# Patient Record
Sex: Male | Born: 1985 | Hispanic: Yes | Marital: Married | Smoking: Former smoker
Health system: Southern US, Community
[De-identification: ages and names within clinical notes are randomized; demographics above are authoritative.]

## PROBLEM LIST (undated history)

## (undated) DIAGNOSIS — T7840XA Allergy, unspecified, initial encounter: Secondary | ICD-10-CM

## (undated) DIAGNOSIS — K219 Gastro-esophageal reflux disease without esophagitis: Secondary | ICD-10-CM

## (undated) HISTORY — PX: HERNIA REPAIR: SHX51

## (undated) HISTORY — PX: OTHER SURGICAL HISTORY: SHX169

## (undated) HISTORY — DX: Gastro-esophageal reflux disease without esophagitis: K21.9

## (undated) HISTORY — DX: Allergy, unspecified, initial encounter: T78.40XA

---
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
## 2007-01-09 ENCOUNTER — Ambulatory Visit: Payer: Self-pay | Admitting: Family Medicine

## 2007-01-09 LAB — CONVERTED CEMR LAB
Cholesterol: 165 mg/dL (ref 0–200)
Glucose, Bld: 87 mg/dL (ref 70–99)
HDL: 35.6 mg/dL — ABNORMAL LOW (ref >39.0)
LDL Cholesterol: 104 mg/dL — ABNORMAL HIGH (ref 0–99)
Total CHOL/HDL Ratio: 4.6
Triglycerides: 129 mg/dL (ref 0–149)
VLDL: 26 mg/dL (ref 0–40)

## 2007-10-29 ENCOUNTER — Ambulatory Visit: Payer: Self-pay | Admitting: Internal Medicine

## 2008-01-16 ENCOUNTER — Encounter (INDEPENDENT_AMBULATORY_CARE_PROVIDER_SITE_OTHER): Payer: Self-pay | Admitting: Family Medicine

## 2008-01-16 ENCOUNTER — Encounter: Payer: Self-pay | Admitting: Family Medicine

## 2008-01-16 ENCOUNTER — Ambulatory Visit: Payer: Self-pay | Admitting: Family Medicine

## 2008-01-16 DIAGNOSIS — J45909 Unspecified asthma, uncomplicated: Secondary | ICD-10-CM | POA: Insufficient documentation

## 2008-01-16 DIAGNOSIS — J309 Allergic rhinitis, unspecified: Secondary | ICD-10-CM | POA: Insufficient documentation

## 2008-04-24 ENCOUNTER — Encounter: Payer: Self-pay | Admitting: Internal Medicine

## 2008-05-08 ENCOUNTER — Encounter: Payer: Self-pay | Admitting: Internal Medicine

## 2008-05-13 ENCOUNTER — Encounter: Payer: Self-pay | Admitting: Internal Medicine

## 2008-08-19 ENCOUNTER — Ambulatory Visit: Payer: Self-pay | Admitting: Internal Medicine

## 2008-09-02 ENCOUNTER — Ambulatory Visit: Payer: Self-pay | Admitting: Internal Medicine

## 2008-10-05 ENCOUNTER — Telehealth (INDEPENDENT_AMBULATORY_CARE_PROVIDER_SITE_OTHER): Payer: Self-pay | Admitting: *Deleted

## 2008-10-06 ENCOUNTER — Ambulatory Visit: Payer: Self-pay | Admitting: Internal Medicine

## 2008-10-08 ENCOUNTER — Encounter: Payer: Self-pay | Admitting: Internal Medicine

## 2008-11-26 ENCOUNTER — Encounter (INDEPENDENT_AMBULATORY_CARE_PROVIDER_SITE_OTHER): Payer: Self-pay | Admitting: *Deleted

## 2009-04-21 ENCOUNTER — Ambulatory Visit: Payer: Self-pay | Admitting: Internal Medicine

## 2009-08-23 ENCOUNTER — Ambulatory Visit: Payer: Self-pay | Admitting: Internal Medicine

## 2009-09-07 ENCOUNTER — Ambulatory Visit: Payer: Self-pay | Admitting: Internal Medicine

## 2009-09-23 ENCOUNTER — Ambulatory Visit: Payer: Self-pay | Admitting: Internal Medicine

## 2009-10-05 ENCOUNTER — Ambulatory Visit: Payer: Self-pay | Admitting: Internal Medicine

## 2009-10-05 LAB — CONVERTED CEMR LAB
Bilirubin Urine: NEGATIVE
Blood in Urine, dipstick: NEGATIVE
Glucose, Urine, Semiquant: NEGATIVE
Heterophile Ab Screen: NEGATIVE
Ketones, urine, test strip: NEGATIVE
Nitrite: NEGATIVE
Protein, U semiquant: NEGATIVE
Rapid Strep: NEGATIVE
Specific Gravity, Urine: 1.01
Urobilinogen, UA: 0.2
WBC Urine, dipstick: NEGATIVE
pH: 5

## 2009-10-06 ENCOUNTER — Telehealth: Payer: Self-pay | Admitting: Internal Medicine

## 2009-10-07 ENCOUNTER — Encounter (INDEPENDENT_AMBULATORY_CARE_PROVIDER_SITE_OTHER): Payer: Self-pay | Admitting: *Deleted

## 2009-10-08 ENCOUNTER — Telehealth: Payer: Self-pay | Admitting: Internal Medicine

## 2009-10-08 ENCOUNTER — Ambulatory Visit: Payer: Self-pay | Admitting: Internal Medicine

## 2009-10-08 ENCOUNTER — Telehealth (INDEPENDENT_AMBULATORY_CARE_PROVIDER_SITE_OTHER): Payer: Self-pay | Admitting: *Deleted

## 2009-10-08 ENCOUNTER — Encounter (INDEPENDENT_AMBULATORY_CARE_PROVIDER_SITE_OTHER): Payer: Self-pay | Admitting: *Deleted

## 2009-10-14 ENCOUNTER — Telehealth: Payer: Self-pay | Admitting: Internal Medicine

## 2010-04-13 ENCOUNTER — Ambulatory Visit: Payer: Self-pay | Admitting: Family Medicine

## 2010-10-03 ENCOUNTER — Ambulatory Visit: Payer: Self-pay | Admitting: Internal Medicine

## 2010-10-26 IMAGING — CT CT NECK W/ CM
5 series · 16 of 33 positions shown, 18 images · IV contrast (agent unspecified)
Comparison: None.

CLINICAL DATA: Sore throat and fever.  Rule out peritonsillar
abscess.

CT NECK WITH CONTRAST
TECHNIQUE: Multidetector CT imaging of the neck was performed with
intravenous contrast.
Contrast: 75 ml 0mnipaque-B55 IV.

[Series 3: neck_routine 3.0 b40s st · axial · 0.45mm/px · z∈[-318,-234]mm · 2 of 85 slices shown]
[im 29/85  bone]
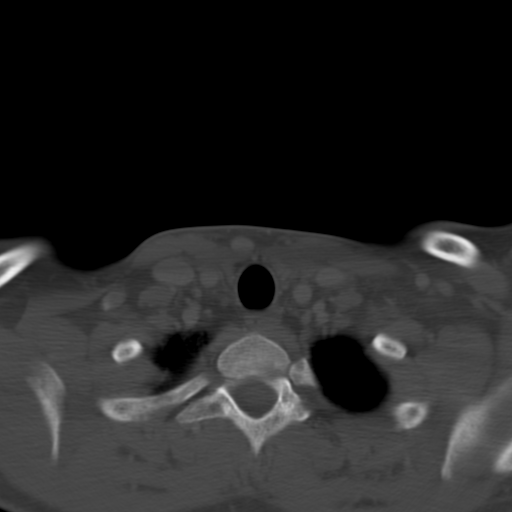
[im 57/85  bone]
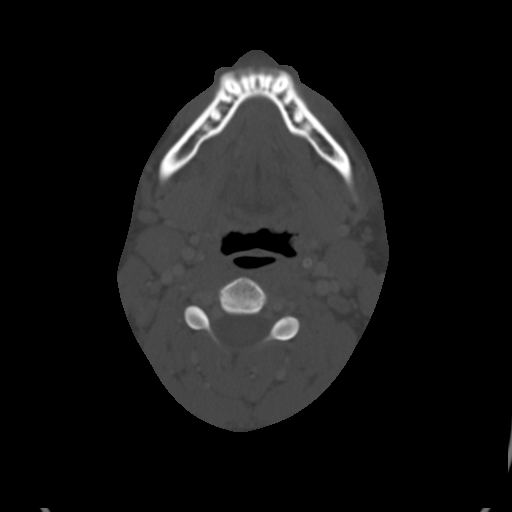

[Series 602: <mpr range> · coronal · 0.50mm/px · 3 of 92 slices shown]
[im 19/92  bone]
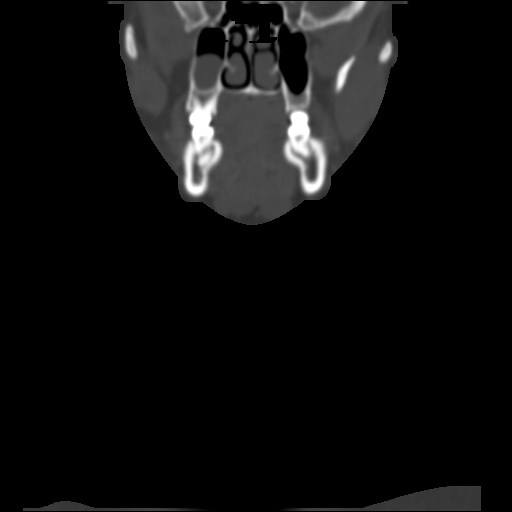
[im 37/92  bone]
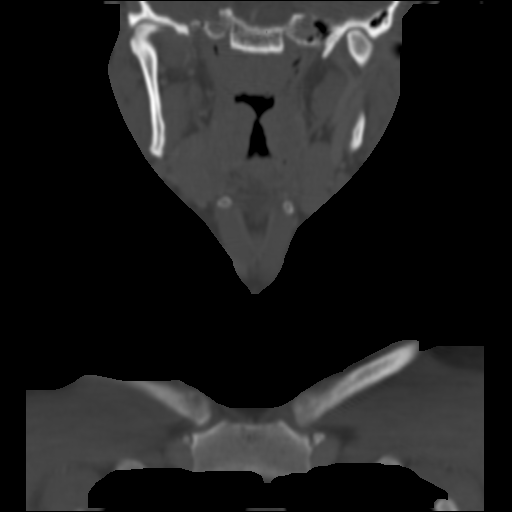
[im 55/92  bone]
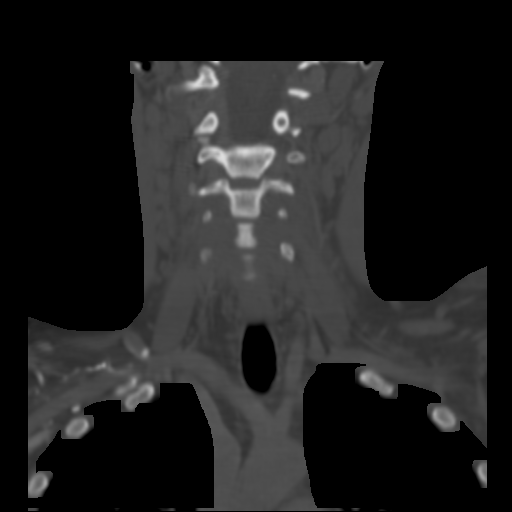

[Series 603: <mpr range(1)> · sagittal · 0.50mm/px · 5 of 84 slices shown, 6 images]
[im 28/84  bone]
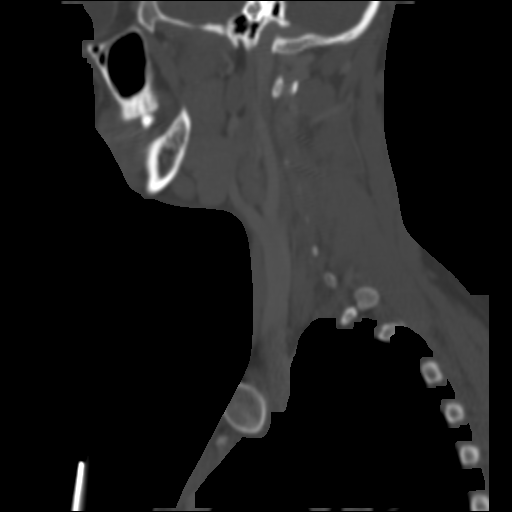
[im 35/84  bone]
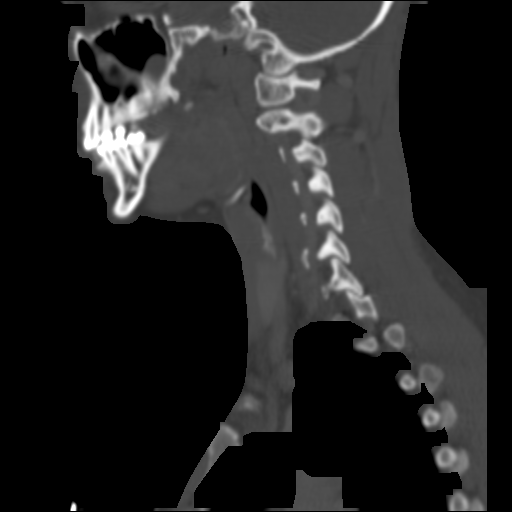
[im 42/84  soft-tissue]
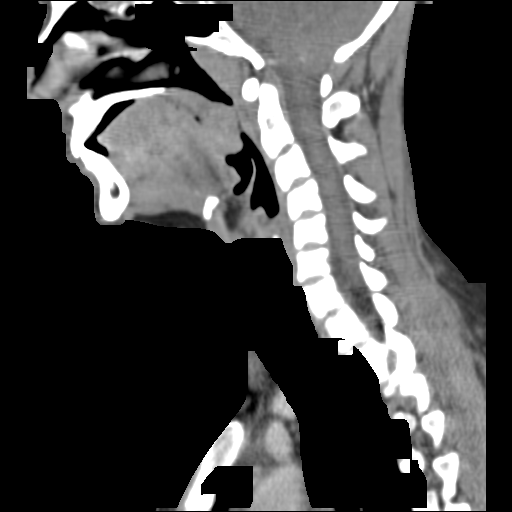
[im 42/84  bone]
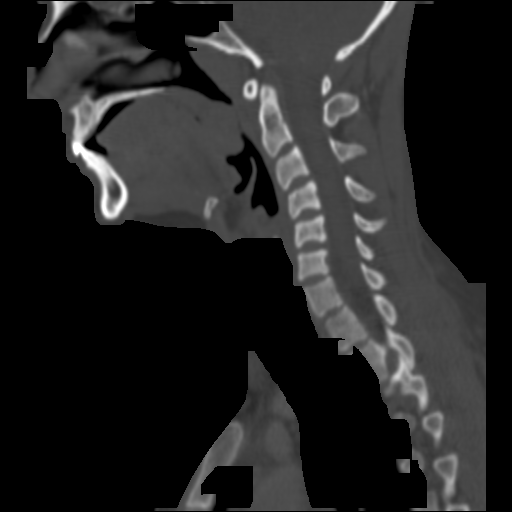
[im 49/84  bone]
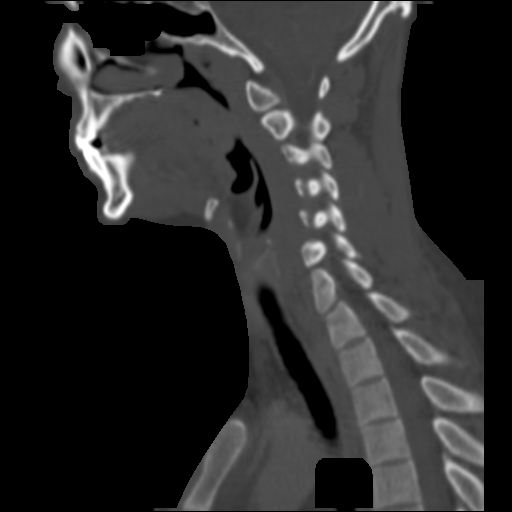
[im 56/84  bone]
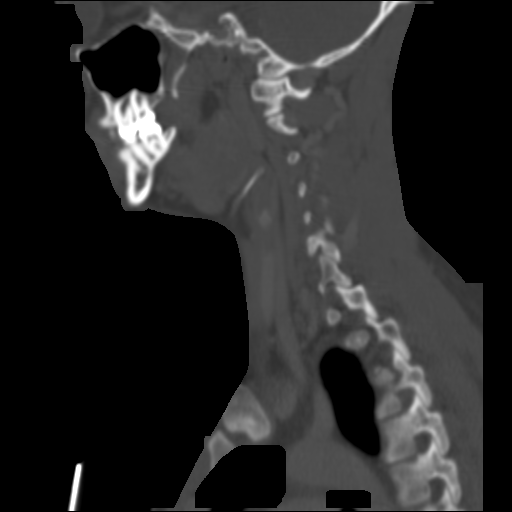

[Series 604: <mpr range(2)> · axial · 0.50mm/px · z∈[-370,-244]mm · 3 of 134 slices shown]
[im 34/134  bone]
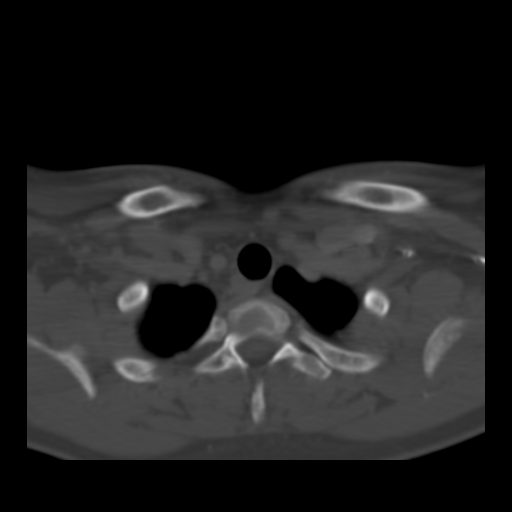
[im 67/134  bone]
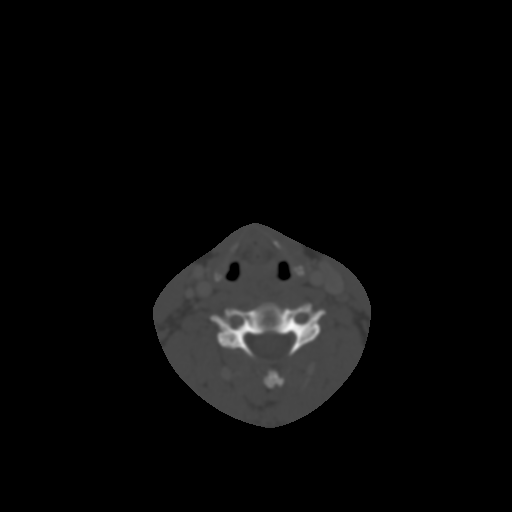
[im 100/134  bone]
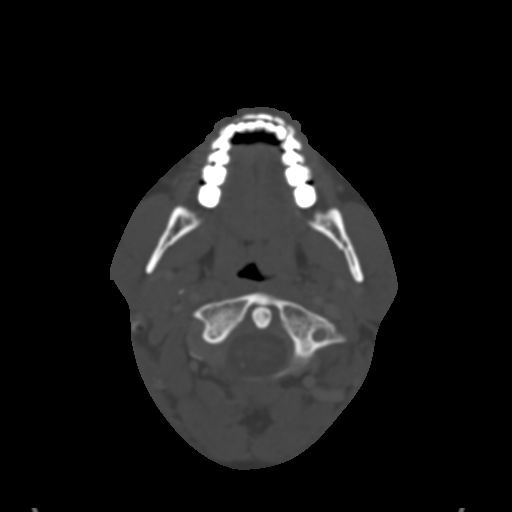

[Series 605: orth · axial · 0.50mm/px · z∈[-370,-242]mm · 3 of 135 slices shown, 4 images]
[im 34/135  soft-tissue]
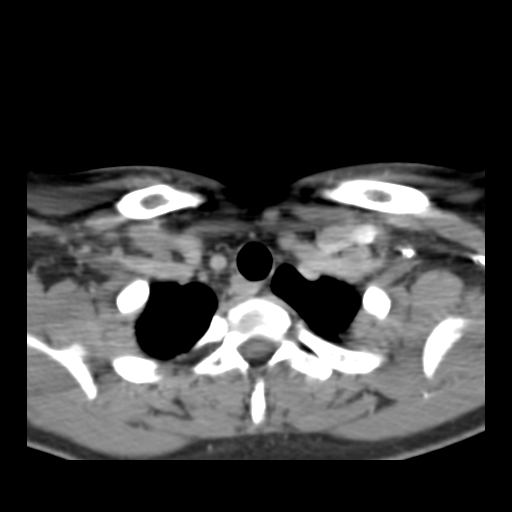
[im 34/135  bone]
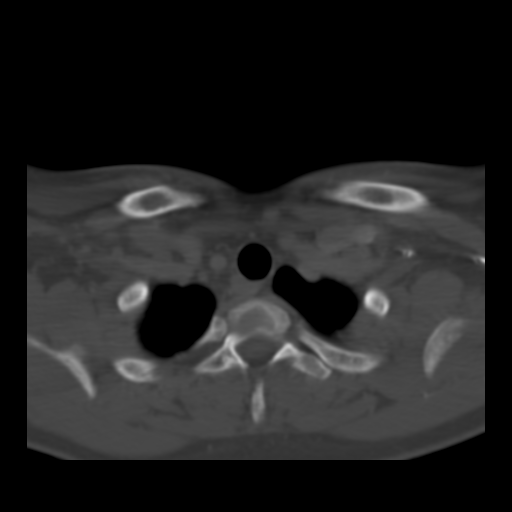
[im 68/135  bone]
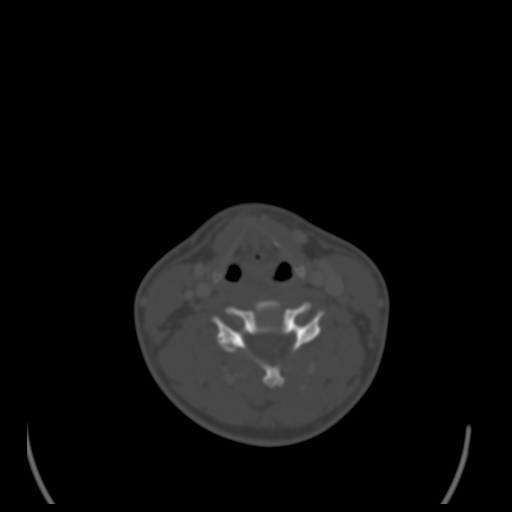
[im 101/135  bone]
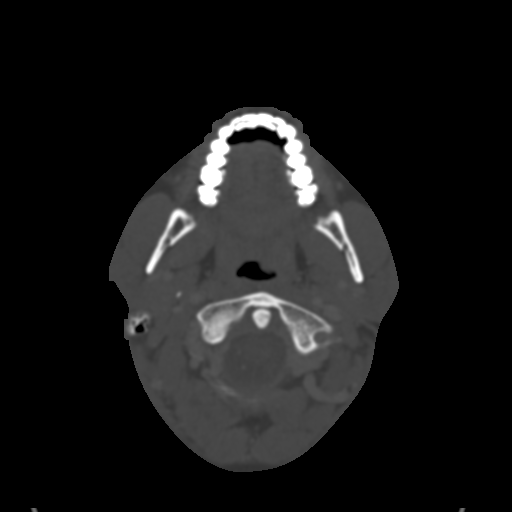

[16 of 33 positions shown; findings below may reference images not displayed]

FINDINGS: There is enlargement of the tonsils bilaterally, right
greater than left.  The tonsils have homogeneous soft tissue
density without evidence of abscess formation.  The epiglottis is
normal.  The larynx is normal.  The thyroid gland is normal.  The
submandibular and parotid glands are normal.

There is cervical adenopathy.  There is a right level II node
measuring 2.0 x 2.0 cm.  There is a left level II node measuring
2.4 x 1.9 cm.  Multiple other level I, level II and level III nodes
are present bilaterally.  These are likely reactive related to
infection.  If these areas do not resolve, biopsy is suggested to
rule out lymphoma.  The lung apices are clear.
IMPRESSION: There is enlargement of the tonsils bilaterally, right greater than
left.  No evidence of tonsillar abscess.

There is cervical lymphadenopathy bilaterally.  These are likely
reactive nodes related to infection.  If  they do not resolve
clinically, biopsy should be considered to exclude lymphoma.

## 2010-11-28 ENCOUNTER — Ambulatory Visit: Payer: Self-pay | Admitting: Internal Medicine

## 2011-01-10 NOTE — Letter (Signed)
Summary: [place] No Show Letter  [facility] [place]  [address] [place], [place] [address]   Phone: [phone]  Fax: [phone]    11/26/2008 MRN: [id number]  Avraham Nand [address] [place], [place]  [address]   Dear Mr. Jennet Maduro,   Our records indicate that you missed your scheduled appointment with _______Dr. Paz______________ on __12/17/09________.  Please contact this office to reschedule your appointment as soon as possible.  It is important that you keep your scheduled appointments with your physician, so we can provide you the best care possible.  Please be advised that there may be a charge for "no show" appointments.    Sincerely,   [facility]

## 2011-01-10 NOTE — Assessment & Plan Note (Signed)
Summary: ? SINUS INFECTION/RH......Marland Kitchen   Vital Signs:  Patient profile:   25 year old male Weight:      203 pounds O2 Sat:      97 % on Room air Temp:     98.3 degrees F oral Pulse rate:   60 / minute BP sitting:   122 / 74  (left arm)  Vitals Entered By: Doristine Devoid (Apr 13, 2010 1:20 PM)  O2 Flow:  Room air CC: sinus congestion, itchy eyes, and wheezing    History of Present Illness: 25 yo man here today for allergies.  sxs started 2 weeks ago.  reports he is starting to 'wheeze'.  itchy eyes.  nasal congestion and drainage.  Using Singulair daily.  Also using nasal steroid.  ran out of albuterol.  sxs are worsening.  denies facial pain/pressure, fever, cough.  Current Medications (verified): 1)  Claritin 10 Mg Caps (Loratadine) .Marland Kitchen.. 1 Over The Counter Pill A Day 2)  Flonase 50 Mcg/act Susp (Fluticasone Propionate) .... 2 Sprays Each Day 3)  Proair Hfa 108 (90 Base) Mcg/act Aers (Albuterol Sulfate) .Marland Kitchen.. 1-2 Puffs Every 4-6 Hours As Needed 4)  Nexium 40 Mg Cpdr (Esomeprazole Magnesium) .Marland Kitchen.. 1 By Mouth Once Daily As Needed 5)  Singulair 10 Mg Tabs (Montelukast Sodium) .... Take One Tablet Daily 6)  Pataday 0.2 % Soln (Olopatadine Hcl) .Marland Kitchen.. 1 Drop Each Eye Daily.  Disp 1 Bottle  Allergies (verified): No Known Drug Allergies  Past History:  Past Medical History: Last updated: 01/16/2008 Allergic rhinitis Asthma  Review of Systems      See HPI  Physical Exam  General:  alert and well-developed.  nontoxic Head:  normocephalic and atraumatic.  Face symmetric, no TTP over sinuses Eyes:  conjunctival injection.   Ears:  External ear exam shows no significant lesions or deformities.  Otoscopic examination reveals clear canals, tympanic membranes are intact bilaterally without bulging, retraction, inflammation or discharge. Hearing is grossly normal bilaterally. Nose:  edematous turbinates Mouth:  + PND Lungs:  normal respiratory effort, no intercostal retractions, no  accessory muscle use, and normal breath sounds. Heart:  normal rate, regular rhythm, and no murmur.  normal rate, regular rhythm, and no murmur.     Impression & Recommendations:  Problem # 1:  ALLERGIC RHINITIS (ICD-477.9) Assessment Unchanged pt's sxs not controlled on Singulair.  add Zyrtec.  pataday for eye symptoms.  no wheezing on exam today but will refer albuterol inhaler.  continue nasal steroid.  reviewed supportive care and red flags that should prompt return.  Pt expresses understanding and is in agreement w/ this plan. The following medications were removed from the medication list:    Claritin 10 Mg Caps (Loratadine) .Marland Kitchen... 1 over the counter pill a day His updated medication list for this problem includes:    Flonase 50 Mcg/act Susp (Fluticasone propionate) .Marland Kitchen... 2 sprays each day    Zyrtec Allergy 10 Mg Tabs (Cetirizine hcl) .Marland Kitchen... 1 once daily prn  Complete Medication List: 1)  Flonase 50 Mcg/act Susp (Fluticasone propionate) .... 2 sprays each day 2)  Proair Hfa 108 (90 Base) Mcg/act Aers (Albuterol sulfate) .Marland Kitchen.. 1-2 puffs every 4-6 hours as needed 3)  Nexium 40 Mg Cpdr (Esomeprazole magnesium) .Marland Kitchen.. 1 by mouth once daily as needed 4)  Singulair 10 Mg Tabs (Montelukast sodium) .... Take one tablet daily 5)  Pataday 0.2 % Soln (Olopatadine hcl) .Marland Kitchen.. 1 drop each eye daily.  disp 1 bottle 6)  Zyrtec Allergy 10 Mg Tabs (  Cetirizine hcl) .Marland Kitchen.. 1 once daily prn  Patient Instructions: 1)  Please schedule a follow-up appointment as needed.  2)  Continue the Singulair and add the Zyrtec daily 3)  Continue the nasal spray every day 4)  Add the pataday eye drop daily to help with the itching 5)  Use the albuterol inhaler for your wheezing 6)  Call if things aren't improving 7)  Hang in there! Prescriptions: PROAIR HFA 108 (90 BASE) MCG/ACT AERS (ALBUTEROL SULFATE) 1-2 puffs every 4-6 hours as needed  #1 x 3   Entered and Authorized by:   Neena Rhymes MD   Signed by:    Neena Rhymes MD on 04/13/2010   Method used:   Electronically to        [facility] [phone]* (retail)       [address]       [place], [place]  [address]       Ph: [phone]       Fax: [phone]   RxID:   [id number] FLONASE 50 MCG/ACT SUSP (FLUTICASONE PROPIONATE) 2 sprays each day  #1 x 6   Entered and Authorized by:   Neena Rhymes MD   Signed by:   Neena Rhymes MD on 04/13/2010   Method used:   [facility] to        [facility] [phone]* (retail)       [address]       [place], [place]  [address]       Ph: [phone]       Fax: [phone]   RxID:   [id number] PATADAY 0.2 % SOLN (OLOPATADINE HCL) 1 drop each eye daily.  disp 1 bottle  #1 x 3   Entered and Authorized by:   Neena Rhymes MD   Signed by:   Neena Rhymes MD on 04/13/2010   Method used:   [facility] to        [facility] [phone]* (retail)       [address]       [place], [place]  [address]       Ph: [phone]       Fax: [phone]   RxID:   [id number]

## 2011-01-10 NOTE — Assessment & Plan Note (Signed)
Summary: FLU SHOT/RH.......  Nurse Visit  CC: Flu shot./kb   Allergies: No Known Drug Allergies  Orders Added: 1)  Admin 1st Vaccine [90471] 2)  Flu Vaccine 61yrs + [19147]         Flu Vaccine Consent Questions     Do you have a history of severe allergic reactions to this vaccine? no    Any prior history of allergic reactions to egg and/or gelatin? no    Do you have a sensitivity to the preservative Thimersol? no    Do you have a past history of Guillan-Barre Syndrome? no    Do you currently have an acute febrile illness? no    Have you ever had a severe reaction to latex? no    Vaccine information given and explained to patient? yes    Are you currently pregnant? no    Lot Number:[id number]   Exp Date:06/10/2011   Site Given  Left Deltoid IMu

## 2011-01-12 ENCOUNTER — Ambulatory Visit: Payer: Self-pay | Admitting: Internal Medicine

## 2011-01-12 NOTE — Assessment & Plan Note (Signed)
Summary: COLD, FEVER, COUGH; ALSO BACK HURTS//SPH--REMIND HIM TO SCHED...   Vital Signs:  Patient profile:   25 year old male Height:      72.5 inches Weight:      194.50 pounds BMI:     26.11 Pulse rate:   77 / minute Pulse rhythm:   regular BP sitting:   124 / 80  (left arm) Cuff size:   large  Vitals Entered By: Army Fossa CMA (November 28, 2010 10:02 AM) CC: Pt here c/o coughing x 1 week. c/o lower back pain- injured 2 weeks ago.  Comments Taking Dayquil  [facility] [address]     History of Present Illness: sick x 1 week cough, chest pain and congestion some neck pain      Current Medications (verified): 1)  Flonase 50 Mcg/act Susp (Fluticasone Propionate) .... 2 Sprays Each Day 2)  Proair Hfa 108 (90 Base) Mcg/act Aers (Albuterol Sulfate) .Marland Kitchen.. 1-2 Puffs Every 4-6 Hours As Needed 3)  Nexium 40 Mg Cpdr (Esomeprazole Magnesium) .Marland Kitchen.. 1 By Mouth Once Daily As Needed. Due For Office Visit. 4)  Singulair 10 Mg Tabs (Montelukast Sodium) .... Take One Tablet Daily  Allergies (verified): No Known Drug Allergies  Past History:  Past Medical History: Reviewed history from 01/16/2008 and no changes required. Allergic rhinitis Asthma  Past Surgical History: Reviewed history from 08/23/2009 and no changes required. R hernia repair R great toe surgery  Social History: Reviewed history from 08/19/2008 and no changes required. original from [place] Single 1 child (2 y/o girl)  Review of Systems General:  ?low grade fever the first 2 days responded to advil . ENT:  (+) sinus congestion , no nasal d/ c mild ST. Resp:  Denies coughing up blood; (+) yellow sputum  increased wheezing from baseline, albuterol two times a day (before used rarely). GI:  Denies diarrhea, nausea, and vomiting.   Physical Exam  General:  alert and well-developed.  nontoxic Head:  face symmetric Ears:  R ear normal and L ear normal.   Nose:  slightly congested Mouth:  no redness or  discharge Lungs:  normal respiratory effort and no intercostal retractions.  few rhonchi bilaterally, end expiratory wheezing. No increased work of breathing Heart:  normal rate, regular rhythm, and no murmur.  normal rate, regular rhythm, and no murmur.     Impression & Recommendations:  Problem # 1:  BRONCHITIS- ACUTE (ICD-466.0) bronchitis with mild asthma exacerbation. Anticipate asthma  will go back to baseline once the bronchitis is better. His updated medication list for this problem includes:    Proair Hfa 108 (90 Base) Mcg/act Aers (Albuterol sulfate) .Marland Kitchen... 1-2 puffs every 4-6 hours as needed    Singulair 10 Mg Tabs (Montelukast sodium) .Marland Kitchen... Take one tablet daily    Zithromax Z-pak 250 Mg Tabs (Azithromycin) .Marland Kitchen... As directed  Problem # 2:  ASTHMA (ICD-493.90) usually very well controlled except for the last few days, see #1 His updated medication list for this problem includes:    Proair Hfa 108 (90 Base) Mcg/act Aers (Albuterol sulfate) .Marland Kitchen... 1-2 puffs every 4-6 hours as needed    Singulair 10 Mg Tabs (Montelukast sodium) .Marland Kitchen... Take one tablet daily    Prednisone 20 Mg Tabs (Prednisone) .Marland Kitchen... 1 by mouth once daily x 5  Complete Medication List: 1)  Flonase 50 Mcg/act Susp (Fluticasone propionate) .... 2 sprays each day 2)  Proair Hfa 108 (90 Base) Mcg/act Aers (Albuterol sulfate) .Marland Kitchen.. 1-2 puffs every 4-6 hours as needed 3)  Nexium 40 Mg Cpdr (Esomeprazole magnesium) .Marland Kitchen.. 1 by mouth once daily as needed. due for office visit. 4)  Singulair 10 Mg Tabs (Montelukast sodium) .... Take one tablet daily 5)  Zithromax Z-pak 250 Mg Tabs (Azithromycin) .... As directed 6)  Prednisone 20 Mg Tabs (Prednisone) .Marland Kitchen.. 1 by mouth once daily x 5  Patient Instructions: 1)  rest, fluids, tylenol 2)  mucinex DM twice a day as needed for cough 3)  zithromax, prednisone as prescribed 4)  albuterol as needed for wheezing 5)  call if no better next week or if symptoms increase   Prescriptions: PREDNISONE 20 MG TABS (PREDNISONE) 1 by mouth once daily x 5  #5 x 0   Entered and Authorized by:   Elita Quick E. Cristela Stalder MD   Signed by:   Nolon Rod. Chrystal Zeimet MD on 11/28/2010   Method used:   Print then Give to Patient   RxID:   [id number] ZITHROMAX Z-PAK 250 MG TABS (AZITHROMYCIN) as directed  #1 x 0   Entered and Authorized by:   Nolon Rod. Gabriell Casimir MD   Signed by:   Nolon Rod. Aranza Geddes MD on 11/28/2010   Method used:   Print then Give to Patient   RxID:   [id number]    Orders Added: 1)  Est. Patient Level III [84696]

## 2011-04-25 ENCOUNTER — Other Ambulatory Visit: Payer: Self-pay | Admitting: Internal Medicine

## 2011-04-28 NOTE — Op Note (Signed)
NAME:  [name], [name] NO.:  [id number]   MEDICAL RECORD NO.:  [id number]                   PATIENT TYPE:  AMB   LOCATION:  [facility]                                  FACILITY:  [facility]   PHYSICIAN:  Lorre Munroe., M.D.            DATE OF BIRTH:  1986/09/18   DATE OF PROCEDURE:  05/13/2004  DATE OF DISCHARGE:                                 OPERATIVE REPORT   PREOPERATIVE DIAGNOSIS:  Indirect left inguinal hernia.   POSTOPERATIVE DIAGNOSIS:  Indirect left inguinal hernia.   OPERATION:  Repair of left inguinal hernia.   SURGEON:  Zigmund Daniel, M.D.   ANESTHESIA:  General.   PROCEDURE:  After the patient was monitored and anesthetized and had routine  preparation and draping of the right lower abdomen and groin, I made a  transverse incision from the edge of the pubic tubercle laterally about 5  cm.  I dissected through the subcutaneous tissues getting hemostasis with  the cautery and encountered the external oblique aponeurosis which I opened  in the direction of the fibers into the superficial ring taking care to  avoid injury to the ilioinguinal nerve.  I could see that the spermatic cord  was somewhat expanded.  I mobilized it at the pubic tubercle and encircled  it with a Penrose drain and dissected the cremaster fibers up to produce  direct exit of the cord from the deep ring.  I then opened the cord  longitudinally in the proximal aspect, found quite a bit of fat in it, and a  fairly large indirect hernia sac.  I reduced the fat in the sac through the  deep ring and plugged the superior aspect of the ring with a cylinder of  polypropylene mesh held in place with a single 2-0 silk stitch.  I fashioned  a patch of polypropylene mesh with a slit cut in it to allow the spermatic  cord to exit from the abdominal wall into the scrotum and sewed that in from  the pubic tubercle and sewed superiorly and medially with a running basting  stitch in the  internal oblique fascia and laterally and inferiorly with a  running simple stitch in the inguinal ligament.  I used 2-0 Prolene suture  for this.  I used a single stitch lateral to the cord and deep ring to join  the tails together and I felt the repair was secure.  I thoroughly  anesthetized the operative site with long acting local anesthetic.  Sponge,  needle, and instrument counts were correct.  I closed the external oblique  and subcutaneous tissues with running layers of 3-0 Vicryl suture and closed  the skin with intracuticular 4-0 Vicryl and Steri-Strips.  He tolerated the  operation well.  Lorre Munroe., M.D.    Jodi Marble  D:  05/13/2004  T:  05/13/2004  Job:  [id number]

## 2011-05-29 ENCOUNTER — Other Ambulatory Visit: Payer: Self-pay | Admitting: Internal Medicine

## 2011-05-29 NOTE — Telephone Encounter (Signed)
Pt needs appt before additional refills are given.

## 2011-06-06 NOTE — Telephone Encounter (Signed)
LMOM for patient to call back.

## 2011-06-13 ENCOUNTER — Encounter: Payer: Self-pay | Admitting: Internal Medicine

## 2011-06-13 NOTE — Telephone Encounter (Signed)
Called only number=home number--patient not home--will mail letter to call and schedule appt in order to continue getting refills

## 2011-06-28 NOTE — Telephone Encounter (Signed)
Patient has appt for Fri 7/20

## 2011-06-29 ENCOUNTER — Encounter: Payer: Self-pay | Admitting: Internal Medicine

## 2011-06-30 ENCOUNTER — Ambulatory Visit: Payer: Self-pay | Admitting: Internal Medicine

## 2011-06-30 DIAGNOSIS — Z0289 Encounter for other administrative examinations: Secondary | ICD-10-CM

## 2011-07-03 ENCOUNTER — Encounter: Payer: Self-pay | Admitting: Internal Medicine

## 2011-07-03 ENCOUNTER — Ambulatory Visit (INDEPENDENT_AMBULATORY_CARE_PROVIDER_SITE_OTHER): Payer: BC Managed Care – PPO | Admitting: Internal Medicine

## 2011-07-03 DIAGNOSIS — J309 Allergic rhinitis, unspecified: Secondary | ICD-10-CM

## 2011-07-03 DIAGNOSIS — J45909 Unspecified asthma, uncomplicated: Secondary | ICD-10-CM

## 2011-07-03 DIAGNOSIS — K219 Gastro-esophageal reflux disease without esophagitis: Secondary | ICD-10-CM

## 2011-07-03 MED ORDER — PROAIR HFA 108 (90 BASE) MCG/ACT IN AERS: 2.0000 | INHALATION_SPRAY | Freq: Four times a day (QID) | RESPIRATORY_TRACT | Status: DC | PRN

## 2011-07-03 MED ORDER — FLUTICASONE PROPIONATE 50 MCG/ACT NA SUSP: 2.0000 | Freq: Every day | NASAL | Status: DC

## 2011-07-03 MED ORDER — ESOMEPRAZOLE MAGNESIUM 40 MG PO CPDR: 40.0000 mg | DELAYED_RELEASE_CAPSULE | Freq: Every day | ORAL | Status: DC

## 2011-07-03 MED ORDER — MONTELUKAST SODIUM 10 MG PO TABS: 10.0000 mg | ORAL_TABLET | Freq: Every day | ORAL | Status: DC

## 2011-07-03 NOTE — Assessment & Plan Note (Signed)
Well controlled, we feel

## 2011-07-03 NOTE — Assessment & Plan Note (Signed)
See history of present illness, no history of heartburn, Prevacid did not help no, current Nexium, symptoms well controlled as long as he takes it routinely. Plan: Keep him on  Nexium for now. At some point we may need to consider a  step down therapy

## 2011-07-03 NOTE — Assessment & Plan Note (Signed)
Symptoms well-controlled, hardly ever uses his inhaler

## 2011-07-03 NOTE — Progress Notes (Signed)
  Subjective:    Patient ID: Newell Wafer, male    DOB: December 22, 1985, 25 y.o.   MRN: [id number]  HPI Here for medication refill, doing well.  Past Medical History  Diagnosis Date  . Allergic rhinitis   . Asthma   . GERD (gastroesophageal reflux disease)    Past Surgical History  Procedure Date  . Hernia repair     right  . Great toe surgery     right      Review of Systems Asthma seems to be well-controlled, no cough or wheezes. Symptoms are usually triggered by strong fumes or chemicals (patient is a Education administrator). Long history of GERD, previously Prevacid did not help, currently on Nexium, symptoms are well controlled as long as he does not forget to take his medication. No dysphasia or odynophagia    Objective:   Physical Exam  Constitutional: He appears well-developed and well-nourished. No distress.  HENT:  Head: Normocephalic and atraumatic.  Cardiovascular: Normal rate, regular rhythm and normal heart sounds.   Musculoskeletal: He exhibits no edema.  Lymphadenopathy:    He has no cervical adenopathy.  Skin: He is not diaphoretic.          Assessment & Plan:

## 2011-09-13 ENCOUNTER — Ambulatory Visit (INDEPENDENT_AMBULATORY_CARE_PROVIDER_SITE_OTHER): Payer: BC Managed Care – PPO

## 2011-09-13 DIAGNOSIS — Z23 Encounter for immunization: Secondary | ICD-10-CM

## 2011-10-20 ENCOUNTER — Ambulatory Visit (INDEPENDENT_AMBULATORY_CARE_PROVIDER_SITE_OTHER): Payer: BC Managed Care – PPO | Admitting: Internal Medicine

## 2011-10-20 ENCOUNTER — Encounter: Payer: Self-pay | Admitting: Internal Medicine

## 2011-10-20 VITALS — BP 108/62 | HR 89 | Temp 97.7°F | Resp 18 | Ht 72.44 in | Wt 192.2 lb

## 2011-10-20 DIAGNOSIS — J45909 Unspecified asthma, uncomplicated: Secondary | ICD-10-CM

## 2011-10-20 MED ORDER — PREDNISONE 10 MG PO TABS: ORAL_TABLET | ORAL | Status: AC

## 2011-10-20 MED ORDER — AMOXICILLIN 500 MG PO CAPS: 1000.0000 mg | ORAL_CAPSULE | Freq: Two times a day (BID) | ORAL | Status: AC

## 2011-10-20 NOTE — Patient Instructions (Signed)
Rest, fluids , tylenol For cough, take Mucinex DM twice a day as needed  For congestion use Sudafed (pseudoephedrine) behind the counter 30 mg every 4 to 6 hours as needed Take prednisone and  Amoxicillin as prescribed  Use the albuterol as needed Call if no better in few days Call anytime if the symptoms are severe, you have high fever, short of breath

## 2011-10-20 NOTE — Assessment & Plan Note (Addendum)
Presents  With an exacerbation, See instructions

## 2011-10-20 NOTE — Progress Notes (Signed)
  Subjective:    Patient ID: Roger Morgan, male    DOB: 1986/01/16, 25 y.o.   MRN: [id number]  HPI Sx started 3 days ago: ST, moderate-severe sinus congestion , wheezing more frecuently, using albuterol 4 - 5 times a day  Past Medical History  Diagnosis Date  . Allergic rhinitis   . Asthma   . GERD (gastroesophageal reflux disease)    Past Surgical History  Procedure Date  . Hernia repair     right  . Great toe surgery     right     Review of Systems no F/C++ cough, more at night, white sputum, no GERD sx at present    Objective:   Physical Exam  Constitutional: He appears well-developed and well-nourished.  HENT:  Head: Normocephalic and atraumatic.  Right Ear: External ear normal.  Left Ear: External ear normal.  Mouth/Throat: No oropharyngeal exudate.       Face symmetric, TTP at frontal sinuses only  Cardiovascular: Normal rate, regular rhythm and normal heart sounds.   No murmur heard. Pulmonary/Chest:       Few ens exp wheezes, no increased WOB          Assessment & Plan:

## 2011-11-23 ENCOUNTER — Other Ambulatory Visit: Payer: Self-pay | Admitting: Internal Medicine

## 2011-11-23 MED ORDER — DESLORATADINE 5 MG PO TABS: 5.0000 mg | ORAL_TABLET | Freq: Every day | ORAL | Status: DC | PRN

## 2011-11-23 NOTE — Telephone Encounter (Signed)
Med removed from list on 04-13-10, last filled 10-06-08 #30 6.Please advise ok to add to med list and fill

## 2011-11-23 NOTE — Telephone Encounter (Signed)
Ok  Clarinex 5 mg 1 by mouth q. day when necessary for  allergies, #90, one refill

## 2011-11-23 NOTE — Telephone Encounter (Signed)
Pt aware Rx sent.  

## 2012-01-15 ENCOUNTER — Ambulatory Visit (INDEPENDENT_AMBULATORY_CARE_PROVIDER_SITE_OTHER): Payer: BC Managed Care – PPO | Admitting: Internal Medicine

## 2012-01-15 ENCOUNTER — Encounter: Payer: Self-pay | Admitting: Internal Medicine

## 2012-01-15 DIAGNOSIS — Z23 Encounter for immunization: Secondary | ICD-10-CM

## 2012-01-15 DIAGNOSIS — J309 Allergic rhinitis, unspecified: Secondary | ICD-10-CM

## 2012-01-15 DIAGNOSIS — Z0000 Encounter for general adult medical examination without abnormal findings: Secondary | ICD-10-CM

## 2012-01-15 NOTE — Patient Instructions (Signed)
Take Align (OTC probiotic) 1 a day for 1 month, call if not improving

## 2012-01-15 NOTE — Assessment & Plan Note (Signed)
No heartburn but some dyspepsia since he went to [place] 11-2011, see instructions (align)

## 2012-01-15 NOTE — Assessment & Plan Note (Addendum)
Td ~ 2004 Had a flu shot Diet, exercise and STE  Discussed  Labs

## 2012-01-15 NOTE — Assessment & Plan Note (Signed)
Well controlled 

## 2012-01-15 NOTE — Progress Notes (Signed)
  Subjective:    Patient ID: Roger Morgan, male    DOB: 04-01-1986, 26 y.o.   MRN: [id number]  HPI CPX  Past Medical History: GERD Allergic rhinitis Asthma  Past Surgical History: R hernia repair R great toe surgery  Social History: original from [facility] Single, 1 child (5 y/o girl) Tobacco--rarely  ETOH-- socially  Works in Pension scheme manager , no regular exercise lately  Family History CAD--no MI-- no Stroke DM-- GM? Colon ca--no Prostate ca--no  Review of Systems Denies chest pain or shortness of breath No nausea, vomiting, diarrhea. Since he came back from [place] last month, he has been having some abdominal discomfort described as "acid" but different from previous heartburn. Denies any diarrhea or blood in the stools. No anxiety or depression     Objective:   Physical Exam  Constitutional: He is oriented to person, place, and time. He appears well-developed and well-nourished.  HENT:  Head: Normocephalic and atraumatic.  Neck: No thyromegaly present.  Cardiovascular: Normal rate, regular rhythm and normal heart sounds.   No murmur heard. Pulmonary/Chest: Effort normal and breath sounds normal. No respiratory distress. He has no wheezes. He has no rales.  Abdominal: Soft. Bowel sounds are normal. He exhibits no distension. There is no tenderness. There is no rebound and no guarding.  Musculoskeletal: He exhibits no edema.  Neurological: He is alert and oriented to person, place, and time.  Skin: Skin is warm and dry.  Psychiatric: He has a normal mood and affect. His behavior is normal. Judgment and thought content normal.       Assessment & Plan:

## 2012-01-16 LAB — CBC WITH DIFFERENTIAL/PLATELET
Basophils Absolute: 0.1 10*3/uL (ref 0.0–0.1)
Basophils Relative: 1.6 % (ref 0.0–3.0)
Eosinophils Absolute: 0.5 10*3/uL (ref 0.0–0.7)
Eosinophils Relative: 9.6 % — ABNORMAL HIGH (ref 0.0–5.0)
HCT: 43.3 % (ref 39.0–52.0)
Hemoglobin: 15.3 g/dL (ref 13.0–17.0)
Lymphocytes Relative: 47.5 % — ABNORMAL HIGH (ref 12.0–46.0)
Lymphs Abs: 2.4 10*3/uL (ref 0.7–4.0)
MCHC: 35.3 g/dL (ref 30.0–36.0)
MCV: 97.1 fl (ref 78.0–100.0)
Monocytes Absolute: 0.4 10*3/uL (ref 0.1–1.0)
Monocytes Relative: 8.6 % (ref 3.0–12.0)
Neutro Abs: 1.6 10*3/uL (ref 1.4–7.7)
Neutrophils Relative %: 32.7 % — ABNORMAL LOW (ref 43.0–77.0)
Platelets: 215 10*3/uL (ref 150.0–400.0)
RBC: 4.46 Mil/uL (ref 4.22–5.81)
RDW: 12 % (ref 11.5–14.6)
WBC: 4.9 10*3/uL (ref 4.5–10.5)

## 2012-01-16 LAB — COMPREHENSIVE METABOLIC PANEL
ALT: 29 U/L (ref 0–53)
AST: 21 U/L (ref 0–37)
Albumin: 4.5 g/dL (ref 3.5–5.2)
Alkaline Phosphatase: 58 U/L (ref 39–117)
BUN: 21 mg/dL (ref 6–23)
CO2: 26 mEq/L (ref 19–32)
Calcium: 9.5 mg/dL (ref 8.4–10.5)
Chloride: 106 mEq/L (ref 96–112)
Creatinine, Ser: 1.2 mg/dL (ref 0.4–1.5)
GFR: 82.44 mL/min (ref >60.00)
Glucose, Bld: 79 mg/dL (ref 70–99)
Potassium: 3.9 mEq/L (ref 3.5–5.1)
Sodium: 141 mEq/L (ref 135–145)
Total Bilirubin: 0.6 mg/dL (ref 0.3–1.2)
Total Protein: 7.2 g/dL (ref 6.0–8.3)

## 2012-01-16 LAB — CHOLESTEROL, TOTAL: Cholesterol: 175 mg/dL (ref 0–200)

## 2012-01-16 LAB — TSH: TSH: 2.03 u[IU]/mL (ref 0.35–5.50)

## 2012-01-17 ENCOUNTER — Encounter: Payer: Self-pay | Admitting: Internal Medicine

## 2012-02-05 ENCOUNTER — Ambulatory Visit (INDEPENDENT_AMBULATORY_CARE_PROVIDER_SITE_OTHER): Payer: BC Managed Care – PPO | Admitting: Family Medicine

## 2012-02-05 ENCOUNTER — Ambulatory Visit: Payer: BC Managed Care – PPO

## 2012-02-05 VITALS — BP 112/70 | HR 53 | Temp 98.2°F | Resp 16 | Ht 71.0 in | Wt 180.0 lb

## 2012-02-05 DIAGNOSIS — M25519 Pain in unspecified shoulder: Secondary | ICD-10-CM

## 2012-02-05 DIAGNOSIS — S43409A Unspecified sprain of unspecified shoulder joint, initial encounter: Secondary | ICD-10-CM

## 2012-02-05 DIAGNOSIS — IMO0002 Reserved for concepts with insufficient information to code with codable children: Secondary | ICD-10-CM

## 2012-02-05 MED ORDER — NAPROXEN 500 MG PO TABS: 500.0000 mg | ORAL_TABLET | Freq: Two times a day (BID) | ORAL | Status: DC

## 2012-02-05 MED ORDER — PROAIR HFA 108 (90 BASE) MCG/ACT IN AERS: 2.0000 | INHALATION_SPRAY | Freq: Four times a day (QID) | RESPIRATORY_TRACT | Status: DC | PRN

## 2012-02-05 MED ORDER — CYCLOBENZAPRINE HCL 5 MG PO TABS: 5.0000 mg | ORAL_TABLET | Freq: Three times a day (TID) | ORAL | Status: AC | PRN

## 2012-02-05 NOTE — Progress Notes (Signed)
  Subjective:    Patient ID: Roger Morgan, male    DOB: 02/28/1986, 26 y.o.   MRN: [id number]  HPI 26 yo male with shoulder pain and injury.  Playing football yesterday and collided with another player with top of left shoulder.  Felt funny motion.  Kept playing.  Pain started this morning.  Had full range of motion last night.  Today hurts with movement now, restricting his motion.   Has injured it similarly before - no known exact injury.  Xrays were fine then and healed up with no problme.s    Review of Systems Negative except as per HPI     Objective:   Physical Exam  Constitutional: He appears well-developed and well-nourished.  Pulmonary/Chest: Effort normal.  Neurological: He is alert.  Skin: Skin is warm.   Left shoulder - appears symmetrical to right.  ttp over posterior musculature.  Extension normal.  Pain with abduction and with lowering from abduction.  Full strength but resisted triceps/biceps does cause pain.  Exam somewhat difficult with some guarding in painful motions. ROM increases with passive movement.    [facility] Primary radiology reading by Dr. Georgiana Shore: No fx/dislocation    Assessment & Plan:  Shoulder pain - suspect sprain.  Flexeril, naproxen, stretch, heat.  Recheck in 3-4 days.

## 2012-03-19 ENCOUNTER — Encounter: Payer: Self-pay | Admitting: Family Medicine

## 2012-03-19 ENCOUNTER — Ambulatory Visit (INDEPENDENT_AMBULATORY_CARE_PROVIDER_SITE_OTHER): Payer: BC Managed Care – PPO | Admitting: Family Medicine

## 2012-03-19 VITALS — BP 112/72 | HR 77 | Temp 98.3°F | Ht 72.0 in | Wt 182.6 lb

## 2012-03-19 DIAGNOSIS — J329 Chronic sinusitis, unspecified: Secondary | ICD-10-CM

## 2012-03-19 MED ORDER — AMOXICILLIN 875 MG PO TABS: 875.0000 mg | ORAL_TABLET | Freq: Two times a day (BID) | ORAL | Status: AC

## 2012-03-19 NOTE — Patient Instructions (Signed)
This is a sinus infection Start the Amoxicillin twice daily w/ food Drink plenty of fluids Continue your allergy meds Alternate tylenol and ibuprofen for pain and fever REST!! Hang in there!!!

## 2012-03-19 NOTE — Assessment & Plan Note (Signed)
Pt's sxs and PE consistent w/ infxn.  Start abx.  Reviewed supportive care and red flags that should prompt return.  Pt expressed understanding and is in agreement w/ plan.  

## 2012-03-19 NOTE — Progress Notes (Signed)
  Subjective:    Patient ID: Roger Morgan, male    DOB: 1986/05/15, 26 y.o.   MRN: [id number]  HPI ? Sinusitis- sxs started Sunday.  + sore throat, nasal congestion, facial pain.  Cough productive of yellow stuff.  Subjective temp last night- improved w/ ibuprofen.  + sick contacts.  No ear pain.  Taking Clarinex, singulair, Flonase.   Review of Systems For ROS see HPI     Objective:   Physical Exam  Vitals reviewed. Constitutional: He appears well-developed and well-nourished. No distress.  HENT:  Head: Normocephalic and atraumatic.  Right Ear: Tympanic membrane normal.  Left Ear: Tympanic membrane normal.  Nose: Mucosal edema and rhinorrhea present. Right sinus exhibits maxillary sinus tenderness and frontal sinus tenderness. Left sinus exhibits maxillary sinus tenderness and frontal sinus tenderness.  Mouth/Throat: Mucous membranes are normal. Oropharyngeal exudate and posterior oropharyngeal erythema present. No posterior oropharyngeal edema.       + PND  Eyes: Conjunctivae and EOM are normal. Pupils are equal, round, and reactive to light.  Neck: Normal range of motion. Neck supple.  Cardiovascular: Normal rate, regular rhythm and normal heart sounds.   Pulmonary/Chest: Effort normal and breath sounds normal. No respiratory distress. He has no wheezes.       + hacking cough  Lymphadenopathy:    He has no cervical adenopathy.  Skin: Skin is warm and dry.          Assessment & Plan:

## 2012-07-27 ENCOUNTER — Other Ambulatory Visit: Payer: Self-pay | Admitting: Internal Medicine

## 2012-07-29 NOTE — Telephone Encounter (Signed)
Refill done.  

## 2012-09-02 ENCOUNTER — Ambulatory Visit (INDEPENDENT_AMBULATORY_CARE_PROVIDER_SITE_OTHER): Payer: BC Managed Care – PPO | Admitting: Internal Medicine

## 2012-09-02 ENCOUNTER — Encounter: Payer: Self-pay | Admitting: Internal Medicine

## 2012-09-02 ENCOUNTER — Telehealth: Payer: Self-pay | Admitting: Internal Medicine

## 2012-09-02 VITALS — BP 118/80 | HR 72 | Temp 98.7°F | Wt 180.2 lb

## 2012-09-02 DIAGNOSIS — K219 Gastro-esophageal reflux disease without esophagitis: Secondary | ICD-10-CM

## 2012-09-02 DIAGNOSIS — R52 Pain, unspecified: Secondary | ICD-10-CM

## 2012-09-02 DIAGNOSIS — R1013 Epigastric pain: Secondary | ICD-10-CM

## 2012-09-02 MED ORDER — DEXLANSOPRAZOLE 60 MG PO CPDR: 60.0000 mg | DELAYED_RELEASE_CAPSULE | Freq: Every day | ORAL | Status: DC

## 2012-09-02 NOTE — Progress Notes (Signed)
  Subjective:    Patient ID: Roger Morgan, male    DOB: 13-Nov-1986, 26 y.o.   MRN: [id number]  HPI Symptoms began 08/24/12 his temperature elevation to 101F at approximately 4 PM. He treated this with 2 Advil.  On 9/16 he began to have nausea and vomiting. This was followed by generalized malaise over the last week . Every morning he has N&V. He describes burning discomfort in the midabdomen and increased burping despite taking Nexium 40 mg daily. He has run out of Nexium; he took Zantac with a similar response he gets with the Nexium.  He rarely drinks alcohol he does not smoke cigarettes. He denies excess intake of caffeine ( 1 glass of tea /day) or nonsteroidals  Past medical history/family history/social history were all reviewed and updated. Pertinent data: P uncle ? Ulcer; PMH of GERD    Review of Systems He describes mood swings with increased anger; this aggravates his abdominal pain.     Objective:   Physical Exam General appearance is one of good health and nourishment w/o distress.  Eyes: No conjunctival inflammation or scleral icterus is present.  Oral exam: Dental hygiene is good; lips and gums are healthy appearing.There is no oropharyngeal erythema or exudate noted.   Heart:  Normal rate and regular rhythm. S1 and S2 normal without gallop, murmur, click, rub or other extra sounds     Lungs:Chest clear to auscultation; no wheezes, rhonchi,rales ,or rubs present.No increased work of breathing.   Abdomen: bowel sounds normal, soft but slightly tender in epigastrium without masses, organomegaly or hernias noted.  No guarding or rebound   Skin:Warm & dry.  Intact without suspicious lesions or rashes ; no jaundice or tenting  Lymphatic: No lymphadenopathy is noted about the head, neck, axilla areas.              Assessment & Plan:  #1 abdominal pain #2 GERD

## 2012-09-02 NOTE — Patient Instructions (Addendum)
The triggers for dyspepsia or "heart burn"  include stress; the "aspirin family" ; alcohol; peppermint; and caffeine (coffee, tea, cola, and chocolate). The aspirin family would include aspirin and the nonsteroidal agents such as ibuprofen &  Naproxen. Tylenol would not cause reflux. If having dyspepsia ; food & drink should be avoided for @ least 2 hours before going to bed.   If you activate My Chart; the results can be released to you as soon as they populate from the lab. If you choose not to use this program; the labs have to be reviewed, copied & mailed causing a delay in getting the results to you.  

## 2012-09-02 NOTE — Telephone Encounter (Signed)
Caller: Kam[place]on/Patient; Patient Name: Roger Morgan; PCP: Willow Ora; Best Callback Phone Number: [phone]; Reason for call: Started with fever and Congestion on 08/24/12-  past week has felt nauseous,weak and has vomited every morning, having some diarrhea, chills, tired, lack of Appetite. He is congested, getting headaches and L ear hurts. Occasional cough with yellowish mucos. He is taking Advil at night with small meal and drinking plenty of fluids. Triage per URI Protocol and appointment advised within 24 hours for "facial pain, yellow-green nasal discharge AND any temperature elevation". Appointment scheduled for 1530 09/02/12 with Dr. Alwyn Ren.

## 2012-09-03 ENCOUNTER — Other Ambulatory Visit: Payer: Self-pay | Admitting: Internal Medicine

## 2012-09-03 DIAGNOSIS — B9681 Helicobacter pylori [H. pylori] as the cause of diseases classified elsewhere: Secondary | ICD-10-CM

## 2012-09-03 LAB — H. PYLORI ANTIBODY, IGG: H Pylori IgG: POSITIVE

## 2012-09-03 LAB — CBC WITH DIFFERENTIAL/PLATELET
Basophils Absolute: 0 10*3/uL (ref 0.0–0.1)
Basophils Relative: 0.6 % (ref 0.0–3.0)
Eosinophils Absolute: 0.2 10*3/uL (ref 0.0–0.7)
Eosinophils Relative: 3 % (ref 0.0–5.0)
HCT: 46.7 % (ref 39.0–52.0)
Hemoglobin: 15.6 g/dL (ref 13.0–17.0)
Lymphocytes Relative: 30.4 % (ref 12.0–46.0)
Lymphs Abs: 1.7 10*3/uL (ref 0.7–4.0)
MCHC: 33.5 g/dL (ref 30.0–36.0)
MCV: 99.1 fl (ref 78.0–100.0)
Monocytes Absolute: 0.4 10*3/uL (ref 0.1–1.0)
Monocytes Relative: 6.5 % (ref 3.0–12.0)
Neutro Abs: 3.3 10*3/uL (ref 1.4–7.7)
Neutrophils Relative %: 59.5 % (ref 43.0–77.0)
Platelets: 242 10*3/uL (ref 150.0–400.0)
RBC: 4.71 Mil/uL (ref 4.22–5.81)
RDW: 12.2 % (ref 11.5–14.6)
WBC: 5.5 10*3/uL (ref 4.5–10.5)

## 2012-09-03 LAB — AMYLASE: Amylase: 102 U/L (ref 27–131)

## 2012-09-03 LAB — HEPATIC FUNCTION PANEL
ALT: 18 U/L (ref 0–53)
AST: 15 U/L (ref 0–37)
Albumin: 4.7 g/dL (ref 3.5–5.2)
Alkaline Phosphatase: 59 U/L (ref 39–117)
Bilirubin, Direct: 0.2 mg/dL (ref 0.0–0.3)
Total Bilirubin: 1.3 mg/dL — ABNORMAL HIGH (ref 0.3–1.2)
Total Protein: 7.4 g/dL (ref 6.0–8.3)

## 2012-09-03 LAB — LIPASE: Lipase: 20 U/L (ref 11.0–59.0)

## 2012-09-03 MED ORDER — AMOXICILL-CLARITHRO-LANSOPRAZ PO MISC: Freq: Two times a day (BID) | ORAL | Status: DC

## 2012-10-01 ENCOUNTER — Ambulatory Visit (INDEPENDENT_AMBULATORY_CARE_PROVIDER_SITE_OTHER): Payer: BC Managed Care – PPO

## 2012-10-01 DIAGNOSIS — Z23 Encounter for immunization: Secondary | ICD-10-CM

## 2012-10-08 ENCOUNTER — Other Ambulatory Visit: Payer: Self-pay | Admitting: Family Medicine

## 2012-10-28 ENCOUNTER — Ambulatory Visit (INDEPENDENT_AMBULATORY_CARE_PROVIDER_SITE_OTHER)
Admission: RE | Admit: 2012-10-28 | Discharge: 2012-10-28 | Disposition: A | Discharge status: Home / Self Care | Payer: BC Managed Care – PPO | Source: Ambulatory Visit | Attending: Internal Medicine | Admitting: Internal Medicine

## 2012-10-28 ENCOUNTER — Encounter: Payer: Self-pay | Admitting: Internal Medicine

## 2012-10-28 ENCOUNTER — Ambulatory Visit (INDEPENDENT_AMBULATORY_CARE_PROVIDER_SITE_OTHER): Payer: BC Managed Care – PPO | Admitting: Internal Medicine

## 2012-10-28 VITALS — BP 110/66 | HR 62 | Temp 98.0°F | Wt 190.0 lb

## 2012-10-28 DIAGNOSIS — S6980XA Other specified injuries of unspecified wrist, hand and finger(s), initial encounter: Secondary | ICD-10-CM

## 2012-10-28 DIAGNOSIS — S6990XA Unspecified injury of unspecified wrist, hand and finger(s), initial encounter: Secondary | ICD-10-CM

## 2012-10-28 NOTE — Patient Instructions (Addendum)
Please get your x-ray at the other [facility]  office located at: [address] [place], across from [facility].  Please go to the basement, this is a walk-in facility, they are open from 8:30 to 5:30 PM. Phone number [phone]. --- We are referring you to a specialist

## 2012-10-28 NOTE — Assessment & Plan Note (Signed)
Suspect fracture versus ligament injury. X-ray Refer to ortho hand Continue using the splinter for now

## 2012-10-28 NOTE — Progress Notes (Signed)
  Subjective:    Patient ID: Roger Morgan, male    DOB: [birth year], 26 y.o.   MRN: [id number]  HPI Acute visit, Injury to his4th left finger playing soccer 2 weeks ago, using splinter, pain is about the same, edema has decrease  Past Medical History  Diagnosis Date  . Allergic rhinitis   . Asthma   . GERD (gastroesophageal reflux disease)    Past Surgical History  Procedure Date  . Hernia repair     right  . Great toe surgery     right     Review of Systems Denies pain in his wrist or any other part of his hand    Objective:   Physical Exam General -- alert, well-developed, and well-nourished.   Extremities-- Right hand normal Left hand normal except for a DIP fourth finger, is swollen, no deformity, slightly tender to palpation, limited flexion.  Psych-- Cognition and judgment appear intact. Alert and cooperative with normal attention span and concentration.  not anxious appearing and not depressed appearing.       Assessment & Plan:

## 2012-11-25 ENCOUNTER — Encounter: Payer: Self-pay | Admitting: Internal Medicine

## 2012-11-25 ENCOUNTER — Ambulatory Visit (INDEPENDENT_AMBULATORY_CARE_PROVIDER_SITE_OTHER): Payer: BC Managed Care – PPO | Admitting: Internal Medicine

## 2012-11-25 VITALS — BP 110/64 | HR 71 | Temp 97.9°F | Wt 194.0 lb

## 2012-11-25 DIAGNOSIS — K219 Gastro-esophageal reflux disease without esophagitis: Secondary | ICD-10-CM

## 2012-11-25 MED ORDER — ESOMEPRAZOLE MAGNESIUM 40 MG PO PACK: 40.0000 mg | PACK | Freq: Every day | ORAL | Status: DC

## 2012-11-25 NOTE — Progress Notes (Signed)
  Subjective:    Patient ID: Trip Cavanagh, male    DOB: 20-Aug-1986, 26 y.o.   MRN: [id number]  HPI Followup  Visit Since the last time he was here, he took H. pylori treatment, symptoms improved temporarily but GERD is again severe: Severe classic heartburn ,day or night. OTC Zantac Alka-Seltzer not helping.  Past Medical History  Diagnosis Date  . Allergic rhinitis   . Asthma   . GERD (gastroesophageal reflux disease)    Past Surgical History  Procedure Date  . Hernia repair     right  . Great toe surgery     right    History   Social History  . Marital Status: Single    Spouse Name: N/A    Number of Children: 1  . Years of Education: N/A   Occupational History  . Not on file.   Social History Main Topics  . Smoking status: Former Smoker    Quit date: 10/19/2010  . Smokeless tobacco: Not on file  . Alcohol Use: Yes     Comment:  rarely  . Drug Use: Not on file  . Sexually Active: Not on file   Other Topics Concern  . Not on file   Social History Narrative   Original from [place]     Review of Systems Denies fever or chills Occasional nausea Denies any change in the color of his stools, blood in the stools or weight loss. No odynophagia or dysphagia per se    Objective:   Physical Exam General -- alert, well-developed   HEENT --  not pale or jaundice Abdomen--soft, non-tender, no distention, no masses, no HSM, no guarding, and no rigidity.   Extremities-- no pretibial edema bilaterally  Psych-- Cognition and judgment appear intact. Alert and cooperative with normal attention span and concentration.  not anxious appearing and not depressed appearing.      Assessment & Plan:

## 2012-11-25 NOTE — Patient Instructions (Signed)
Diet for Gastroesophageal Reflux Disease, Adult  Reflux (acid reflux) is when acid from your stomach flows up into the esophagus. When acid comes in contact with the esophagus, the acid causes irritation and soreness (inflammation) in the esophagus. When reflux happens often or so severely that it causes damage to the esophagus, it is called gastroesophageal reflux disease (GERD). Nutrition therapy can help ease the discomfort of GERD.  FOODS OR DRINKS TO AVOID OR LIMIT   Smoking or chewing tobacco. Nicotine is one of the most potent stimulants to acid production in the gastrointestinal tract.   Caffeinated and decaffeinated coffee and black tea.   Regular or low-calorie carbonated beverages or energy drinks (caffeine-free carbonated beverages are allowed).    Strong spices, such as black pepper, white pepper, red pepper, cayenne, curry powder, and chili powder.   Peppermint or spearmint.   Chocolate.   High-fat foods, including meats and fried foods. Extra added fats including oils, butter, salad dressings, and nuts. Limit these to less than 8 tsp per day.   Fruits and vegetables if they are not tolerated, such as citrus fruits or tomatoes.   Alcohol.   Any food that seems to aggravate your condition.  If you have questions regarding your diet, call your caregiver or a registered dietitian.  OTHER THINGS THAT MAY HELP GERD INCLUDE:    Eating your meals slowly, in a relaxed setting.   Eating 5 to 6 small meals per day instead of 3 large meals.   Eliminating food for a period of time if it causes distress.   Not lying down until 3 hours after eating a meal.   Keeping the head of your bed raised 6 to 9 inches (15 to 23 cm) by using a foam wedge or blocks under the legs of the bed. Lying flat may make symptoms worse.   Being physically active. Weight loss may be helpful in reducing reflux in overweight or obese adults.   Wear loose fitting clothing  EXAMPLE MEAL PLAN  This meal plan is approximately  2,000 calories based on ChooseMyPlate.gov meal planning guidelines.  Breakfast    cup cooked oatmeal.   1 cup strawberries.   1 cup low-fat milk.   1 oz almonds.  Snack   1 cup cucumber slices.   6 oz yogurt (made from low-fat or fat-free milk).  Lunch   2 slice whole-wheat bread.   2 oz sliced turkey.   2 tsp mayonnaise.   1 cup blueberries.   1 cup snap peas.  Snack   6 whole-wheat crackers.   1 oz string cheese.  Dinner    cup brown rice.   1 cup mixed veggies.   1 tsp olive oil.   3 oz grilled fish.  Document Released: 11/27/2005 Document Revised: 02/19/2012 Document Reviewed: 10/13/2011  Exit[employer] Patient Information 2013 [employer].

## 2012-11-25 NOTE — Assessment & Plan Note (Addendum)
Here for eval of GERD symptoms-- on 08-2012 saw one of my partners w/ increased GI symptoms, CBC- LFTs-amylase lipase were normal but H. pylori serology was positive. Took Prepack as Rx, sx decreased temporarily  But heartburn is back. Plan: Nexium daily,  30 minutes before breakfast. DC other meds GERD precautions carefully discussed. RTC 6 week, GI referral if not improving

## 2013-02-17 ENCOUNTER — Other Ambulatory Visit: Payer: Self-pay | Admitting: Physician Assistant

## 2013-02-22 ENCOUNTER — Other Ambulatory Visit: Payer: Self-pay | Admitting: Physician Assistant

## 2013-02-22 ENCOUNTER — Other Ambulatory Visit: Payer: Self-pay | Admitting: Internal Medicine

## 2013-02-24 NOTE — Telephone Encounter (Signed)
Refill done.  

## 2013-04-08 ENCOUNTER — Encounter: Payer: BC Managed Care – PPO | Admitting: Internal Medicine

## 2013-04-15 ENCOUNTER — Encounter: Payer: Self-pay | Admitting: Internal Medicine

## 2013-04-15 ENCOUNTER — Ambulatory Visit (INDEPENDENT_AMBULATORY_CARE_PROVIDER_SITE_OTHER): Payer: BC Managed Care – PPO | Admitting: Internal Medicine

## 2013-04-15 VITALS — BP 104/70 | HR 60 | Temp 98.1°F | Wt 185.0 lb

## 2013-04-15 DIAGNOSIS — J45909 Unspecified asthma, uncomplicated: Secondary | ICD-10-CM

## 2013-04-15 DIAGNOSIS — J309 Allergic rhinitis, unspecified: Secondary | ICD-10-CM

## 2013-04-15 DIAGNOSIS — K219 Gastro-esophageal reflux disease without esophagitis: Secondary | ICD-10-CM

## 2013-04-15 MED ORDER — BECLOMETHASONE DIPROPIONATE 80 MCG/ACT IN AERS: 2.0000 | INHALATION_SPRAY | Freq: Two times a day (BID) | RESPIRATORY_TRACT | Status: DC

## 2013-04-15 MED ORDER — FLUTICASONE PROPIONATE 50 MCG/ACT NA SUSP: 2.0000 | Freq: Every day | NASAL | Status: DC

## 2013-04-15 MED ORDER — MONTELUKAST SODIUM 10 MG PO TABS: 10.0000 mg | ORAL_TABLET | Freq: Every day | ORAL | Status: DC

## 2013-04-15 MED ORDER — ESOMEPRAZOLE MAGNESIUM 40 MG PO PACK: 40.0000 mg | PACK | Freq: Every day | ORAL | Status: DC

## 2013-04-15 MED ORDER — PROAIR HFA 108 (90 BASE) MCG/ACT IN AERS: 2.0000 | INHALATION_SPRAY | Freq: Four times a day (QID) | RESPIRATORY_TRACT | Status: DC | PRN

## 2013-04-15 NOTE — Assessment & Plan Note (Signed)
Sx not well controlled , restart flonase and singulair; start claritin Call if no better

## 2013-04-15 NOTE — Progress Notes (Signed)
  Subjective:    Patient ID: Roger Morgan, male    DOB: 02-15-1986, 26 y.o.   MRN: [id number]  HPI Acute visit Ran out of Nexium about a month ago, was taking pantoprazole from his sister but is not helping as well as Nexium did. Allergies are very active, currently not taking any medication.  Past Medical History  Diagnosis Date  . Allergic rhinitis   . Asthma   . GERD (gastroesophageal reflux disease)    , Past Surgical History  Procedure Laterality Date  . Hernia repair      right  . Great toe surgery      right     Review of Systems No fever or chills. He does have some cough in the morning and  Wheezing  on and off, takes albuterol up to 3 times a day. No chest pain. No nausea, vomiting, change in the color of the stools.    Objective:   Physical Exam General -- alert, well-developed. No apparent distress HEENT -- TMs normal, throat w/o redness, face symmetric and not tender to palpation, nose not congested  Lungs -- normal respiratory effort, no intercostal retractions, no accessory muscle use, and normal breath sounds.   Heart-- normal rate, regular rhythm, no murmur, and no gallop.   Extremities-- no pretibial edema bilaterally  Neurologic-- alert & oriented X3 and strength normal in all extremities. Psych-- Cognition and judgment appear intact. Alert and cooperative with normal attention span and concentration.  not anxious appearing and not depressed appearing.     Assessment & Plan:

## 2013-04-15 NOTE — Assessment & Plan Note (Addendum)
Not well controlled , uses alb 3/day Will restart singulair but likely will benefit from qvair as well, see instructions

## 2013-04-15 NOTE — Patient Instructions (Signed)
Qvair 2 puffs Twice a day every day Claritin 10 mg OTC one tablet daily Take all the other medications Imdur Lasix as well. If symptoms are not well controlled or you need  Albuterol more than 5 times a week let me know. Please schedule a   routine visit in 4 months

## 2013-04-15 NOTE — Assessment & Plan Note (Signed)
Off nexium (run out), took protonix (from sister) and did not help enough: RF Nexium

## 2013-04-28 ENCOUNTER — Telehealth: Payer: Self-pay | Admitting: Internal Medicine

## 2013-04-28 NOTE — Telephone Encounter (Signed)
Pt called to see if dr Drue Novel could write him something else in for nexium because his insurance does not cover it. Pt would like a call back thanks

## 2013-04-29 MED ORDER — OMEPRAZOLE 40 MG PO CPDR: 40.0000 mg | DELAYED_RELEASE_CAPSULE | Freq: Every day | ORAL | Status: DC

## 2013-04-29 NOTE — Telephone Encounter (Signed)
Patient states his heartburn is really bad, and would like an alternate medicine other than Nexium, to be approved and submitted by Dr. Drue Novel.  Please advise.

## 2013-04-29 NOTE — Telephone Encounter (Signed)
Refill done.  

## 2013-04-29 NOTE — Telephone Encounter (Signed)
Please advise 

## 2013-04-29 NOTE — Telephone Encounter (Signed)
Omeprazole 40 mg one by mouth every morning before breakfast #30 and 6 refills.

## 2013-06-01 ENCOUNTER — Ambulatory Visit (INDEPENDENT_AMBULATORY_CARE_PROVIDER_SITE_OTHER): Payer: BC Managed Care – PPO | Admitting: Family Medicine

## 2013-06-01 ENCOUNTER — Encounter: Payer: Self-pay | Admitting: Family Medicine

## 2013-06-01 ENCOUNTER — Ambulatory Visit: Payer: BC Managed Care – PPO

## 2013-06-01 VITALS — BP 116/70 | HR 77 | Temp 98.5°F | Resp 18 | Wt 185.0 lb

## 2013-06-01 DIAGNOSIS — M25572 Pain in left ankle and joints of left foot: Secondary | ICD-10-CM

## 2013-06-01 DIAGNOSIS — M25579 Pain in unspecified ankle and joints of unspecified foot: Secondary | ICD-10-CM

## 2013-06-01 DIAGNOSIS — S82831A Other fracture of upper and lower end of right fibula, initial encounter for closed fracture: Secondary | ICD-10-CM

## 2013-06-01 DIAGNOSIS — S82839A Other fracture of upper and lower end of unspecified fibula, initial encounter for closed fracture: Secondary | ICD-10-CM

## 2013-06-01 MED ORDER — HYDROCODONE-ACETAMINOPHEN 5-325 MG PO TABS: 1.0000 | ORAL_TABLET | Freq: Four times a day (QID) | ORAL | Status: DC | PRN

## 2013-06-01 NOTE — Progress Notes (Signed)
[address]   [place], [place]  [address]   [phone]  Subjective:    Patient ID: Roger Morgan, male    DOB: 02-01-1986, 27 y.o.   MRN: [id number]  HPI This 27 y.o. male presents for evaluation of ankle injury.  Today, slip into second base and twisted ankle lateral.  Tried to pop self up, with acute twisting.  +swelling lateral aspect.  Wrapped ankle and iced.  +numbness lateral foot.  Can put pressure on ankle but really painful.  Georganna Skeans.  No previous injury to ankle other than mild sprains.  No foot pain or swelling.  Got lightheaded after event.  PCP: Drue Novel    Review of Systems  Constitutional: Negative for fever, chills, diaphoresis and fatigue.  Musculoskeletal: Positive for myalgias, joint swelling and arthralgias.  Skin: Negative for wound.  Neurological: Positive for numbness. Negative for weakness.    Past Medical History  Diagnosis Date  . Allergic rhinitis   . Asthma   . GERD (gastroesophageal reflux disease)     Past Surgical History  Procedure Laterality Date  . Hernia repair      right  . Great toe surgery      right     Prior to Admission medications   Medication Sig Start Date End Date Taking? Authorizing Provider  beclomethasone (QVAR) 80 MCG/ACT inhaler Inhale 2 puffs into the lungs 2 (two) times daily. 04/15/13  Yes Wanda Plump, MD  fluticasone [facility]) 50 MCG/ACT nasal spray Place 2 sprays into the nose daily. 04/15/13  Yes Wanda Plump, MD  loratadine (CLARITIN) 10 MG tablet Take 10 mg by mouth daily.   Yes Historical Provider, MD  montelukast (SINGULAIR) 10 MG tablet Take 1 tablet (10 mg total) by mouth at bedtime. 04/15/13  Yes Wanda Plump, MD  PROAIR HFA 108 [phone] BASE) MCG/ACT inhaler Inhale 2 puffs into the lungs every 6 (six) hours as needed for wheezing. 04/15/13  Yes Wanda Plump, MD  esomeprazole (NEXIUM) 40 MG packet Take 40 mg by mouth daily before breakfast. 04/15/13   Wanda Plump, MD  omeprazole (PRILOSEC) 40 MG capsule Take 1 capsule (40 mg total) by  mouth daily. 04/29/13   Wanda Plump, MD    No Known Allergies  History   Social History  . Marital Status: Single    Spouse Name: N/A    Number of Children: 1  . Years of Education: N/A   Occupational History  . Not on file.   Social History Main Topics  . Smoking status: Former Smoker    Quit date: 10/19/2010  . Smokeless tobacco: Not on file  . Alcohol Use: Yes     Comment:  rarely  . Drug Use: Not on file  . Sexually Active: Not on file   Other Topics Concern  . Not on file   Social History Narrative   Original from [facility]          Family History  Problem Relation Age of Onset  . Ulcers Paternal Uncle        Objective:   Physical Exam  Nursing note and vitals reviewed. Constitutional: He is oriented to person, place, and time. He appears well-developed and well-nourished. No distress.  Cardiovascular:  Pulses:      Dorsalis pedis pulses are 2+ on the left side.  Capillary refill < 3 seconds  Musculoskeletal:       Left ankle: He exhibits decreased range of motion and swelling. He exhibits  no ecchymosis and normal pulse. Tenderness. Lateral malleolus tenderness found. No medial malleolus, no head of 5th metatarsal and no proximal fibula tenderness found. Achilles tendon normal. Achilles tendon exhibits no pain.       Left foot: He exhibits normal range of motion, no tenderness, no bony tenderness, no swelling, normal capillary refill and no deformity.  Neurological: He is alert and oriented to person, place, and time. No sensory deficit.  Skin: He is not diaphoretic.   [facility] reading (PRIMARY) by  Dr. Katrinka Blazing.  L  ANKLE: avulsion fracture fibula old versus new?     Assessment & Plan:  Left ankle pain - Plan: DG Ankle Complete Left, DISCONTINUED: HYDROcodone-acetaminophen (NORCO/VICODIN) 5-325 MG per tablet  Closed avulsion fracture of distal fibula, right, initial encounter - Plan: Ambulatory referral to Orthopedic Surgery, DISCONTINUED:  HYDROcodone-acetaminophen (NORCO/VICODIN) 5-325 MG per tablet   1. L ankle pain lateral:  New.  Rx for Hydrocodone provided.   2.  L ankle avulsion fracture distal fibula:  New.  Refer to ortho.  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  Recommend rest, ice, elevation.  Meds ordered this encounter  Medications  . DISCONTD: HYDROcodone-acetaminophen (NORCO/VICODIN) 5-325 MG per tablet    Sig: Take 1 tablet by mouth every 6 (six) hours as needed for pain.    Dispense:  40 tablet    Refill:  0

## 2013-06-02 ENCOUNTER — Encounter: Payer: Self-pay | Admitting: Internal Medicine

## 2013-06-02 ENCOUNTER — Telehealth: Payer: Self-pay | Admitting: Radiology

## 2013-06-02 ENCOUNTER — Ambulatory Visit (INDEPENDENT_AMBULATORY_CARE_PROVIDER_SITE_OTHER): Payer: BC Managed Care – PPO | Admitting: Internal Medicine

## 2013-06-02 VITALS — BP 100/55 | HR 79 | Temp 98.1°F | Ht 73.0 in | Wt 181.4 lb

## 2013-06-02 DIAGNOSIS — Z0000 Encounter for general adult medical examination without abnormal findings: Secondary | ICD-10-CM

## 2013-06-02 DIAGNOSIS — K219 Gastro-esophageal reflux disease without esophagitis: Secondary | ICD-10-CM

## 2013-06-02 DIAGNOSIS — F419 Anxiety disorder, unspecified: Secondary | ICD-10-CM

## 2013-06-02 MED ORDER — ESCITALOPRAM OXALATE 10 MG PO TABS: 10.0000 mg | ORAL_TABLET | Freq: Every day | ORAL | Status: DC

## 2013-06-02 MED ORDER — PANTOPRAZOLE SODIUM 40 MG PO TBEC: 40.0000 mg | DELAYED_RELEASE_TABLET | Freq: Two times a day (BID) | ORAL | Status: DC

## 2013-06-02 NOTE — Assessment & Plan Note (Signed)
Td 2013  Diet, exercise and STE  Discussed  Labs

## 2013-06-02 NOTE — Telephone Encounter (Signed)
OK to call rx into pharmacy.  I placed his paperwork in the CAM walker plastic bag; his prescription should be with paperwork.

## 2013-06-02 NOTE — Patient Instructions (Signed)
Stop omeprazole, start pantoprazole  twice a day Avoid Motrin, Advil, ibuprofen or Aleve. Start escitalopram Next visit in 4-5 weeks.

## 2013-06-02 NOTE — Progress Notes (Signed)
Subjective:    Patient ID: Roger Morgan, male    DOB: 1986/09/29, 27 y.o.   MRN: [id number]  HPI Complete physical exam. We also talked about GERD symptoms and anxiety, see below. Also, he injured the left ankle yesterday, already was eval @ a urgent care.  Past Medical History  Diagnosis Date  . Allergic rhinitis   . Asthma   . GERD (gastroesophageal reflux disease)    Past Surgical History  Procedure Laterality Date  . Hernia repair      right  . Great toe surgery      right    History   Social History  . Marital Status: Single    Spouse Name: N/A    Number of Children: 2  . Years of Education: N/A   Occupational History  . painter     Social History Main Topics  . Smoking status: Former Smoker    Quit date: 10/19/2010  . Smokeless tobacco: Never Used  . Alcohol Use: Yes     Comment:  rarely  . Drug Use: Yes     Comment: marihuana sometimes  . Sexually Active: Not on file   Other Topics Concern  . Not on file   Social History Narrative   Original from [facility]         Family History  Problem Relation Age of Onset  . Ulcers Paternal Uncle   . Colon cancer Neg Hx   . Prostate cancer Neg Hx   . Diabetes Other     MGM  . CAD Neg Hx   . Breast cancer      aunt      Review of Systems GERD symptoms not well-controlled, was unable to buy Nexium do to cost, currently on omeprazole 40 mg every morning before breakfast, he continue with frequent heartburn. No  nausea, vomiting, diarrhea. He feels a "full stomach " every morning, sometimes needs to self induce vomit to feel better, he only vomits clear fluids, never blood and undigested food. No blood in the stools or change in the color of the stools. Some epigastric burning postprandially.  On further questioning, he also is quite anxious since he had his second child, having financial challenges. Is concerned about the well-being of his family and" think about that all the time", reports lack of  appetite thinks related to anxiety, has lost several pounds. He smokes marijuana from time to time " to feel better and get some appetite back". Denies depression per se  Denies chest pain or shortness or breath.     Objective:   Physical Exam BP 100/55  Pulse 79  Temp(Src) 98.1 F (36.7 C) (Oral)  Ht 6\' 1"  (1.854 m)  Wt 181 lb 6.4 oz (82.283 kg)  BMI 23.94 kg/m2  SpO2 97%  General -- alert, well-developed  Neck --no thyromegaly , normal carotid pulse  HEENT -- not pale Lungs -- normal respiratory effort, no intercostal retractions, no accessory muscle use, and normal breath sounds.   Heart-- normal rate, regular rhythm, no murmur, and no gallop.   Abdomen--soft, non-tender, no distention, no masses, no HSM, no guarding, and no rigidity.   Extremities-- No edema on the right leg, left leg is in a air cast. Neurologic-- alert & oriented X3 and strength normal in all extremities. Psych-- Cognition and judgment appear intact. Alert and cooperative with normal attention span and concentration.  Emotional during the interview, mildly anxious appearing and not depressed appearing.       Assessment &  Plan:

## 2013-06-02 NOTE — Telephone Encounter (Signed)
Rx located, it was faxed by melanie, did not go through. Refaxed. Left message for patient so I can advise.

## 2013-06-02 NOTE — Assessment & Plan Note (Addendum)
See previous entries. Not doing well, c/o heartburn and" feeling full" in the stomach. Unable to get Nexium due to cost, good compliance with omeprazole. Has lost some weight, we are checking labs to be sure he doesn't have hyperglycemia, thyroid dz, anemia. Plan: Change to Protonix twice a day Reassess in 4-5 weeks If not better will need further eval. Anxiety may be playing a role in his symptoms.

## 2013-06-02 NOTE — Telephone Encounter (Signed)
PT CALLED TO SAY THAT AMY HAD CALLED HIM AND HE WAS RETURNING THE CALL. PLEASE CALL [phone]

## 2013-06-02 NOTE — Telephone Encounter (Signed)
Patient calling because his rx not at the pharmacy. I have asked him to look through his papers and he states he does not have it, did you give to him? Is it okay to call it in?

## 2013-06-02 NOTE — Telephone Encounter (Signed)
Called about the Rx. Left message for him to call back.

## 2013-06-02 NOTE — Telephone Encounter (Signed)
Patient advised Rx found and has been faxed for him.

## 2013-06-02 NOTE — Assessment & Plan Note (Addendum)
Symptoms consistent with anxiety, see detailed description of symptoms above. patient is counseled, I think he will benefit tremendously from Lexapro, prescription provided, return to the office in 4-5 weeks.

## 2013-06-03 LAB — CBC WITH DIFFERENTIAL/PLATELET
Basophils Absolute: 0 10*3/uL (ref 0.0–0.1)
Basophils Relative: 0.5 % (ref 0.0–3.0)
Eosinophils Absolute: 0.2 10*3/uL (ref 0.0–0.7)
Eosinophils Relative: 1.9 % (ref 0.0–5.0)
HCT: 44.4 % (ref 39.0–52.0)
Hemoglobin: 15.2 g/dL (ref 13.0–17.0)
Lymphocytes Relative: 20.9 % (ref 12.0–46.0)
Lymphs Abs: 1.9 10*3/uL (ref 0.7–4.0)
MCHC: 34.3 g/dL (ref 30.0–36.0)
MCV: 99.2 fl (ref 78.0–100.0)
Monocytes Absolute: 0.7 10*3/uL (ref 0.1–1.0)
Monocytes Relative: 7.1 % (ref 3.0–12.0)
Neutro Abs: 6.4 10*3/uL (ref 1.4–7.7)
Neutrophils Relative %: 69.6 % (ref 43.0–77.0)
Platelets: 233 10*3/uL (ref 150.0–400.0)
RBC: 4.48 Mil/uL (ref 4.22–5.81)
RDW: 12.5 % (ref 11.5–14.6)
WBC: 9.1 10*3/uL (ref 4.5–10.5)

## 2013-06-03 LAB — CHOLESTEROL, TOTAL: Cholesterol: 184 mg/dL (ref 0–200)

## 2013-06-03 LAB — COMPREHENSIVE METABOLIC PANEL
ALT: 15 U/L (ref 0–53)
AST: 15 U/L (ref 0–37)
Albumin: 4.7 g/dL (ref 3.5–5.2)
Alkaline Phosphatase: 57 U/L (ref 39–117)
BUN: 22 mg/dL (ref 6–23)
CO2: 24 mEq/L (ref 19–32)
Calcium: 9.6 mg/dL (ref 8.4–10.5)
Chloride: 103 mEq/L (ref 96–112)
Creatinine, Ser: 1.3 mg/dL (ref 0.4–1.5)
GFR: 68.35 mL/min (ref >60.00)
Glucose, Bld: 108 mg/dL — ABNORMAL HIGH (ref 70–99)
Potassium: 3.7 mEq/L (ref 3.5–5.1)
Sodium: 138 mEq/L (ref 135–145)
Total Bilirubin: 1.3 mg/dL — ABNORMAL HIGH (ref 0.3–1.2)
Total Protein: 7.5 g/dL (ref 6.0–8.3)

## 2013-06-03 LAB — TSH: TSH: 1.79 u[IU]/mL (ref 0.35–5.50)

## 2013-06-09 ENCOUNTER — Ambulatory Visit: Payer: BC Managed Care – PPO

## 2013-06-09 ENCOUNTER — Ambulatory Visit (INDEPENDENT_AMBULATORY_CARE_PROVIDER_SITE_OTHER): Payer: BC Managed Care – PPO | Admitting: Emergency Medicine

## 2013-06-09 VITALS — BP 108/65 | HR 64 | Temp 98.0°F | Resp 17 | Ht 73.0 in | Wt 183.0 lb

## 2013-06-09 DIAGNOSIS — S8290XD Unspecified fracture of unspecified lower leg, subsequent encounter for closed fracture with routine healing: Secondary | ICD-10-CM

## 2013-06-09 DIAGNOSIS — S82892E Other fracture of left lower leg, subsequent encounter for open fracture type I or II with routine healing: Secondary | ICD-10-CM

## 2013-06-09 NOTE — Progress Notes (Signed)
  Subjective:    Patient ID: Roger Morgan, male    DOB: 01-May-1986, 27 y.o.   MRN: [id number]  HPI Patient here for a follow up of his left ankle Hurts only when he stands and the pain is on the left side of his ankle where the swelling is saw a FX when he saw Dr Katrinka Blazing and swelling   Review of Systems     Objective:   Physical Exam there is significant swelling medially and laterally. I do not see a definite fracture on the films I did today [facility] reading (PRIMARY) by  Dr. Cleta Alberts no definite fracture seen on these films         Assessment & Plan:  He is to do some gentle range of motion activities with his ankle. He is to recheck in 7-10 days. Depending on his findings at that time would suggest referral to physical therapy to strengthen his ankle

## 2013-10-16 ENCOUNTER — Other Ambulatory Visit: Payer: Self-pay

## 2014-01-14 ENCOUNTER — Encounter: Payer: Self-pay | Admitting: Internal Medicine

## 2014-01-14 ENCOUNTER — Ambulatory Visit (INDEPENDENT_AMBULATORY_CARE_PROVIDER_SITE_OTHER): Payer: BC Managed Care – PPO | Admitting: Internal Medicine

## 2014-01-14 VITALS — BP 106/83 | HR 100 | Temp 99.9°F | Wt 181.0 lb

## 2014-01-14 DIAGNOSIS — J45909 Unspecified asthma, uncomplicated: Secondary | ICD-10-CM

## 2014-01-14 DIAGNOSIS — B349 Viral infection, unspecified: Secondary | ICD-10-CM

## 2014-01-14 DIAGNOSIS — K219 Gastro-esophageal reflux disease without esophagitis: Secondary | ICD-10-CM

## 2014-01-14 DIAGNOSIS — F411 Generalized anxiety disorder: Secondary | ICD-10-CM

## 2014-01-14 DIAGNOSIS — B9789 Other viral agents as the cause of diseases classified elsewhere: Secondary | ICD-10-CM

## 2014-01-14 DIAGNOSIS — R509 Fever, unspecified: Secondary | ICD-10-CM

## 2014-01-14 DIAGNOSIS — F419 Anxiety disorder, unspecified: Secondary | ICD-10-CM

## 2014-01-14 LAB — POCT URINALYSIS DIPSTICK
Glucose, UA: NEGATIVE
Leukocytes, UA: NEGATIVE
Nitrite, UA: NEGATIVE
Protein, UA: 3
Spec Grav, UA: 1.01
Urobilinogen, UA: NEGATIVE
pH, UA: 6

## 2014-01-14 MED ORDER — OSELTAMIVIR PHOSPHATE 75 MG PO CAPS: 75.0000 mg | ORAL_CAPSULE | Freq: Two times a day (BID) | ORAL | Status: DC

## 2014-01-14 NOTE — Progress Notes (Signed)
Pre visit review using our clinic review tool, if applicable. No additional management support is needed unless otherwise documented below in the visit note. 

## 2014-01-14 NOTE — Assessment & Plan Note (Signed)
Unable to get Qvar d/t cost, rarely uses albuterol

## 2014-01-14 NOTE — Assessment & Plan Note (Signed)
Not taking any medication at this time

## 2014-01-14 NOTE — Progress Notes (Signed)
   Subjective:    Patient ID: Eyvonne LeftJose Deeg, male    DOB: 10/30/1986, 28 y.o.   MRN: [id number]  HPI Acute visit Woke up 01-12-14 with fever, chills, cough, mild sputum production. He is taking Sudafed and robitussin with some help. Fever decrease with OTCs, he does have some myalgias. He is feeling weak, decreased appetite.  Past Medical History  Diagnosis Date  . Allergic rhinitis   . Asthma   . GERD (gastroesophageal reflux disease)    Past Surgical History  Procedure Laterality Date  . Hernia repair      right  . Great toe surgery      right      Review of Systems Admits to nausea, some vomiting and mild diarrhea without blood. He has a global headache that decrease with Advil. No rash, no neck stiffness. + Sore throat, no ear pain. When asked admits to some dysuria but no gross hematuria. Asthma is currently well controlled, when he is not sick he hardly ever uses proair.    Objective:   Physical Exam BP 106/83  Pulse 100  Temp(Src) 99.9 F (37.7 C)  Wt 181 lb (82.101 kg)  SpO2 96% General -- alert, well-developed, NAD. Not toxic or acutely ill appearing HEENT-- Not pale. TMs normal, throat symmetric, no redness or discharge. Face symmetric, sinuses not tender to palpation. Nose quite  Congested. Neck-- FROM  Lungs -- normal respiratory effort, no intercostal retractions, no accessory muscle use, and normal breath sounds.  Heart-- normal rate, regular rhythm, no murmur.  Abdomen-- Not distended, good bowel sounds,soft, slt tender at the LLQ w/o rebound, mass,organomegaly. Skin-- Face, abdomen, torso and arms without  rash Extremities-- no pretibial edema bilaterally  Neurologic--  alert & oriented X3. Speech normal, gait normal, strength normal in all extremities.  Psych-- Cognition and judgment appear intact. Cooperative with normal attention span and concentration. No anxious or depressed appearing.      Assessment & Plan:   Viral syndrome, 2 days h/o a   viral syndrome with predominantly respiratory symptoms. He does have a headache but no other findings c/w a serious infection such as meningitis. Mildly tender left lower abdomen without peritoneal signs. Flu test neg Udip: + Ketones, trace of blood. Send a UCX He probably has the flu or a flulike illness, will start Tamiflu.see instructions

## 2014-01-14 NOTE — Patient Instructions (Signed)
Rest, drink plenty of clear fluids   Start taking Tamiflu as soon as possible  For  fever or pain: Tylenol  500 mg OTC 2 tabs a day every 8 hours as needed for pain or Motrin 200 mg 2 tablets every 6 hours as needed for pain. Always take it with food because may cause gastritis and ulcers. If you notice nausea, stomach pain, change in the color of stools --->  Stop the medicine and let us know  For cough, take Mucinex DM twice a day as needed   For congestion use OTC Nasocort: 2 nasal sprays on each side of the nose daily until you feel better   Call if no better in few days Call anytime if the symptoms are severe, you have high fever, arash, severe headache, unable to keep any fluids down, dizziness

## 2014-01-14 NOTE — Assessment & Plan Note (Signed)
On Protonix as needed 

## 2014-01-15 LAB — URINALYSIS, ROUTINE W REFLEX MICROSCOPIC
Hgb urine dipstick: NEGATIVE
Ketones, ur: 40 — AB
Leukocytes, UA: NEGATIVE
Nitrite: NEGATIVE
RBC / HPF: NONE SEEN (ref 0–?)
Specific Gravity, Urine: >=1.03 — AB (ref 1.000–1.030)
Urine Glucose: NEGATIVE
Urobilinogen, UA: 0.2 (ref 0.0–1.0)
pH: 6 (ref 5.0–8.0)

## 2014-01-15 LAB — URINE CULTURE
Colony Count: NO GROWTH
Organism ID, Bacteria: NO GROWTH

## 2014-03-03 ENCOUNTER — Other Ambulatory Visit: Payer: Self-pay | Admitting: Internal Medicine

## 2014-06-19 IMAGING — CR DG ANKLE COMPLETE 3+V*L*
3 series · 3 of 3 positions shown · non-contrast
Comparison: None.

CLINICAL DATA: Pain and swelling after trauma.

LEFT ANKLE COMPLETE - 3+ VIEW

[AP]
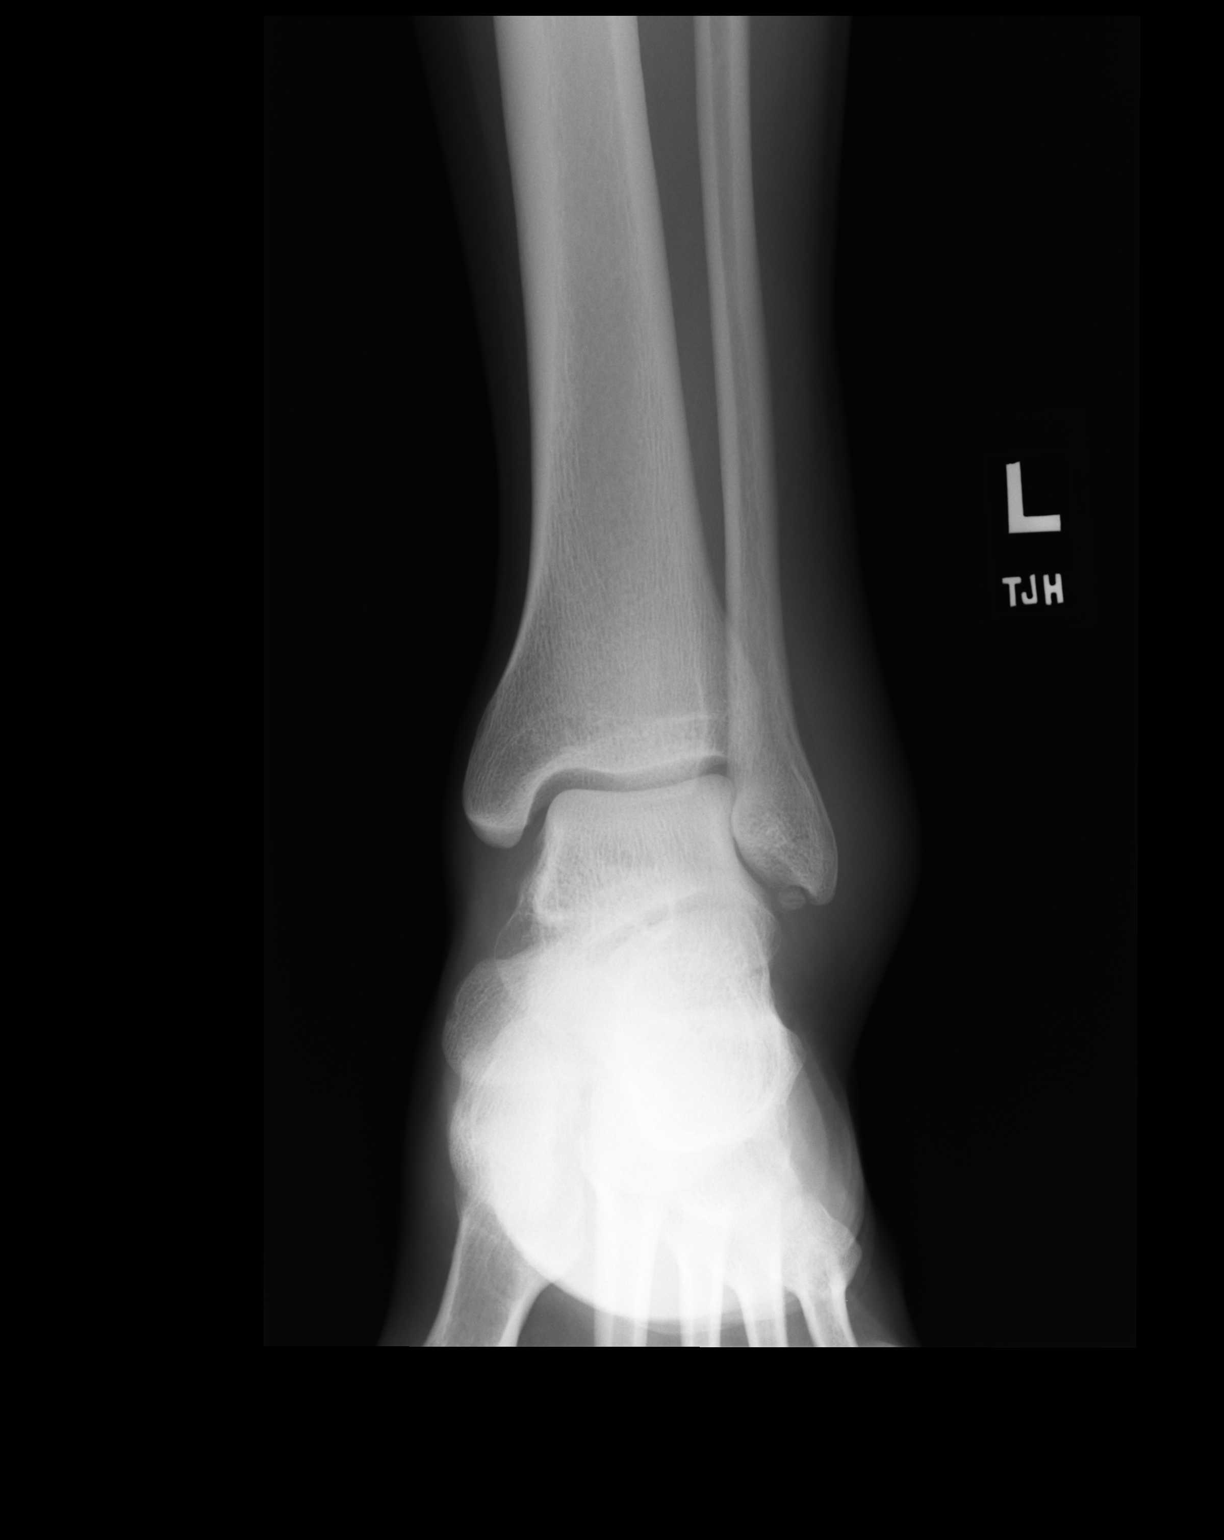

[ap obl int rot]
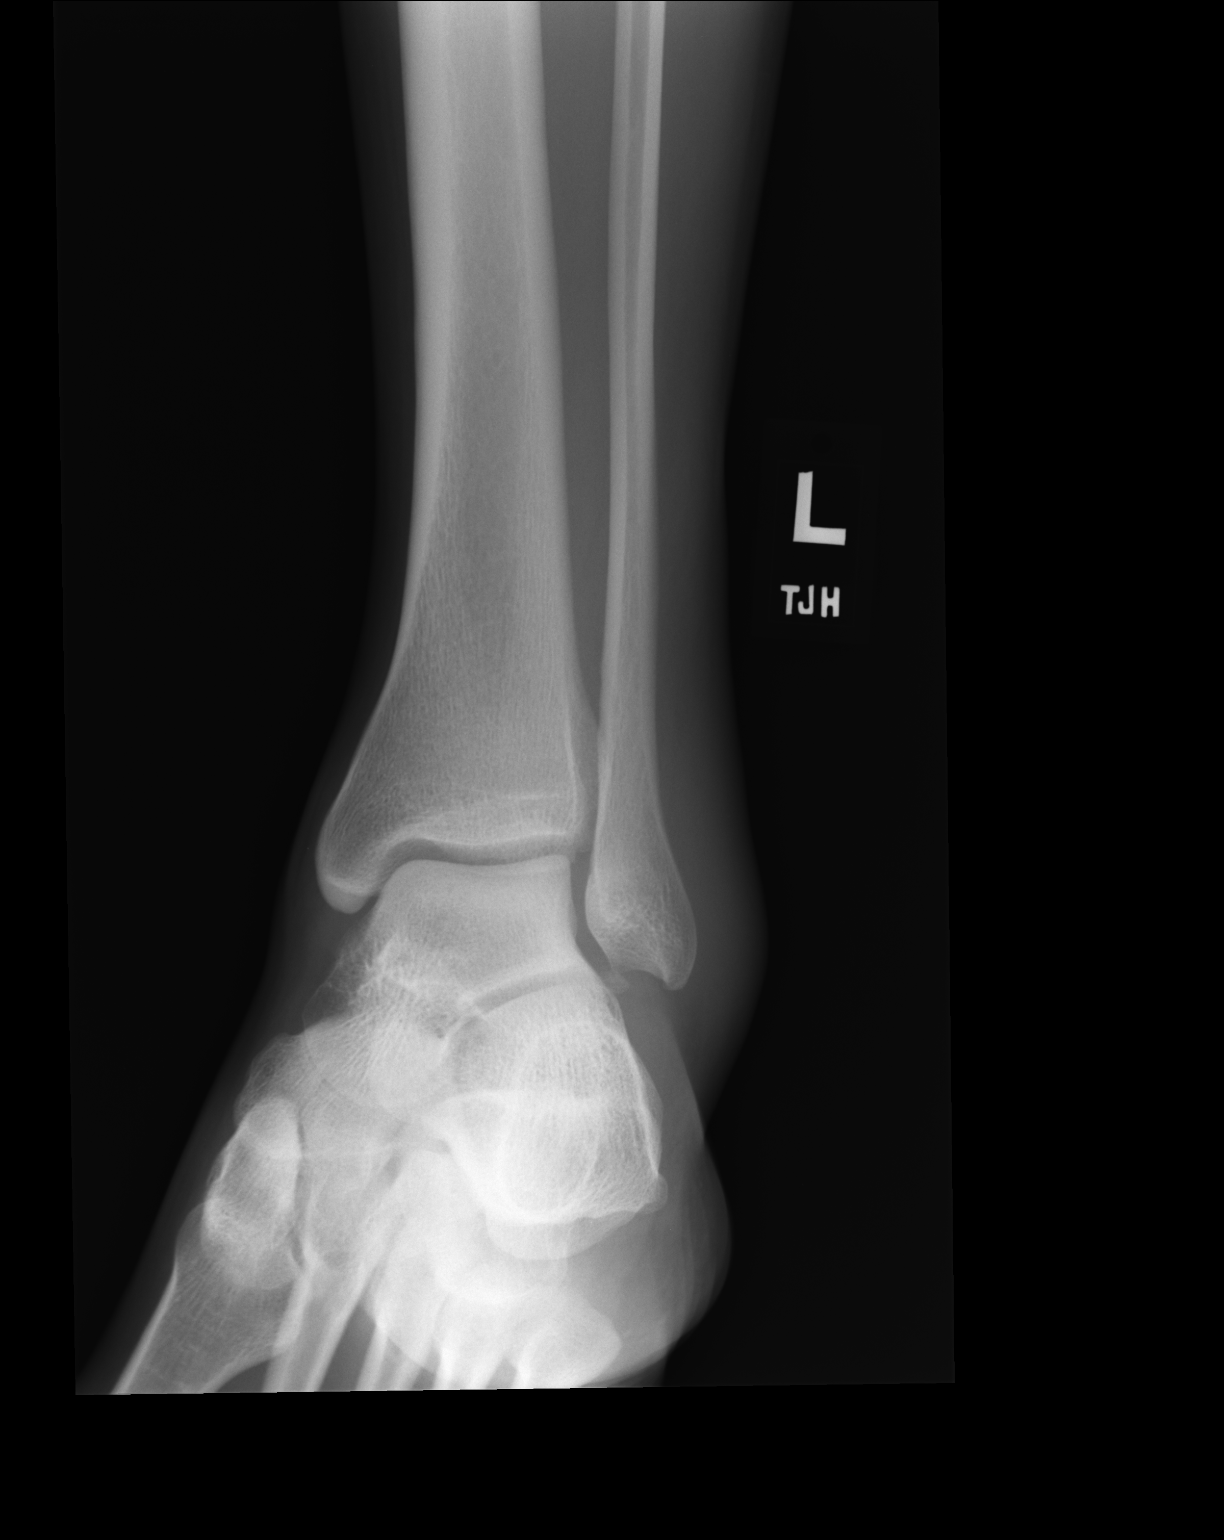

[lateral]
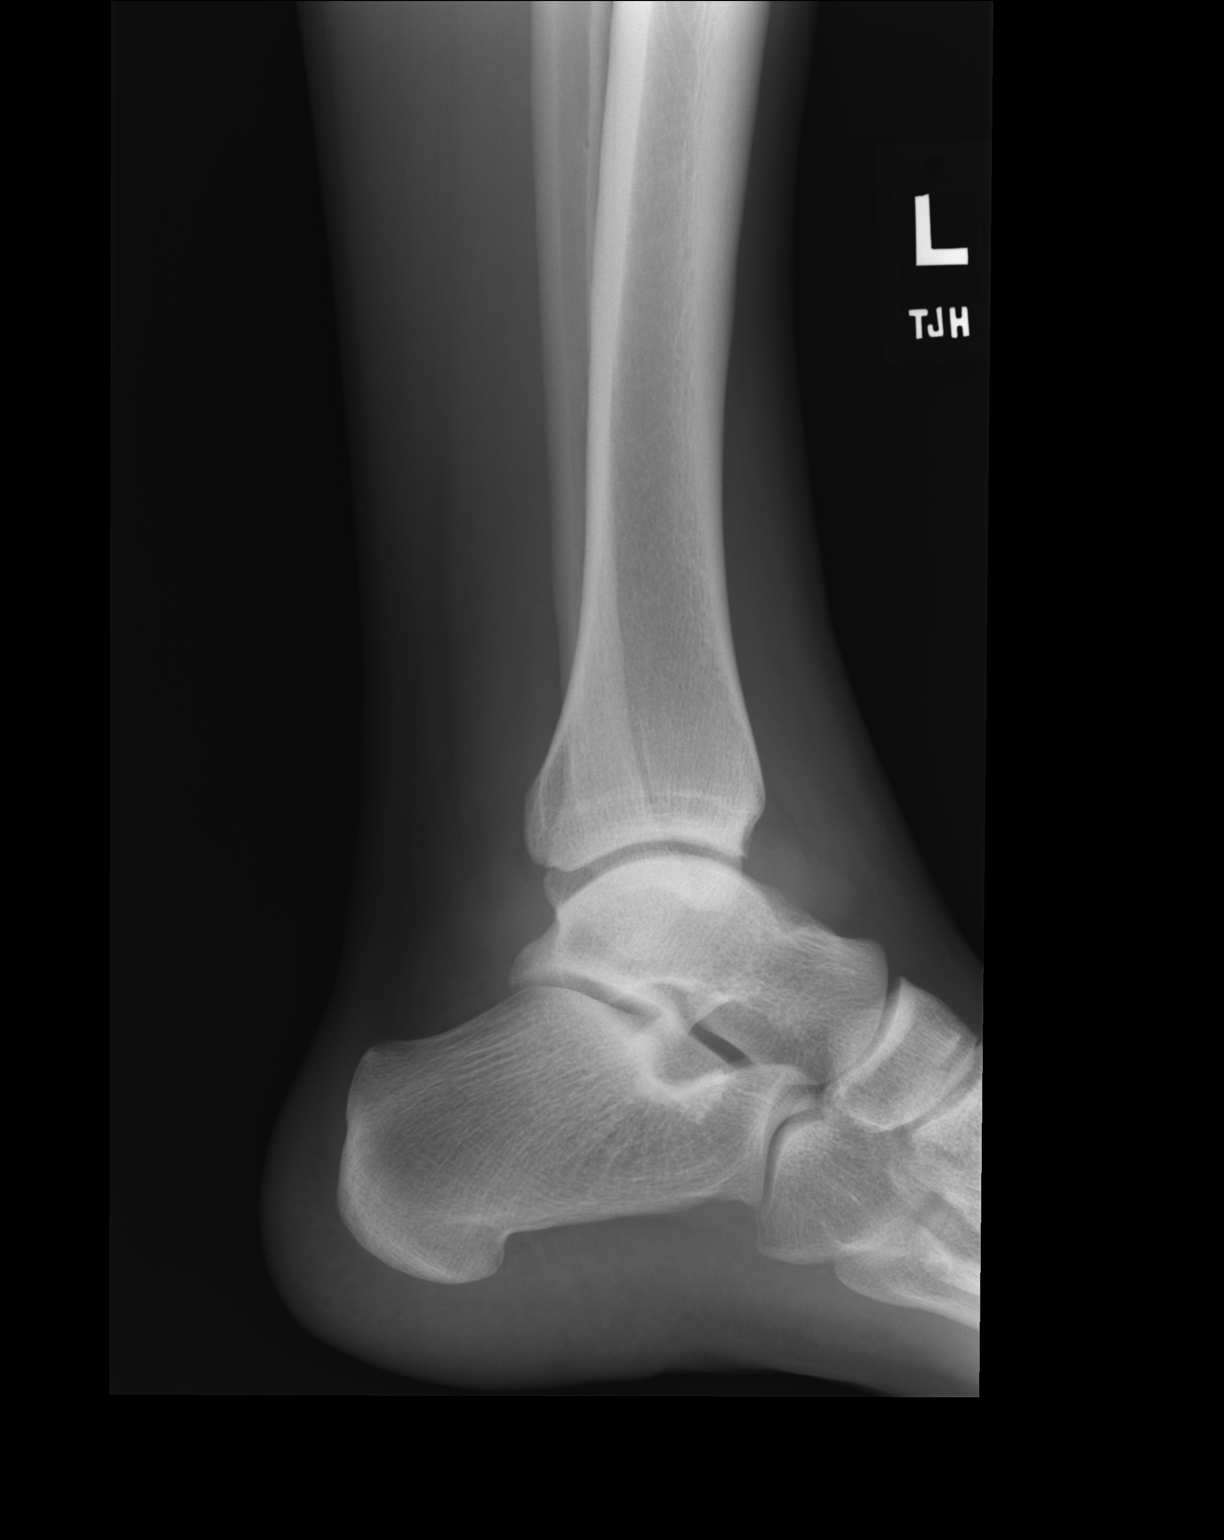

[3 of 3 positions shown; findings below may reference images not displayed]

FINDINGS: There is an old avulsion of the tip of the lateral
malleolus.  There is also slight spurring at the anterior aspect of
the distal tibia.  There is prominent lateral soft tissue swelling
is well as a prominent ankle effusion.
IMPRESSION: Ankle effusion with soft tissue swelling.  The avulsion from the
tip of the fibula is felt to be old.

Clinically significant discrepancy from primary report, if
provided: The avulsion of the fibula is felt to be old.

## 2014-09-25 ENCOUNTER — Other Ambulatory Visit: Payer: Self-pay

## 2016-09-08 ENCOUNTER — Encounter: Payer: Self-pay | Admitting: Internal Medicine

## 2016-09-08 ENCOUNTER — Ambulatory Visit (INDEPENDENT_AMBULATORY_CARE_PROVIDER_SITE_OTHER): Payer: 59 | Admitting: Internal Medicine

## 2016-09-08 VITALS — BP 106/68 | HR 66 | Temp 97.8°F | Resp 14 | Ht 73.0 in | Wt 186.2 lb

## 2016-09-08 DIAGNOSIS — Z09 Encounter for follow-up examination after completed treatment for conditions other than malignant neoplasm: Secondary | ICD-10-CM | POA: Insufficient documentation

## 2016-09-08 DIAGNOSIS — Z114 Encounter for screening for human immunodeficiency virus [HIV]: Secondary | ICD-10-CM

## 2016-09-08 DIAGNOSIS — Z0000 Encounter for general adult medical examination without abnormal findings: Secondary | ICD-10-CM | POA: Diagnosis not present

## 2016-09-08 LAB — COMPREHENSIVE METABOLIC PANEL
ALT: 21 U/L (ref 9–46)
AST: 14 U/L (ref 10–40)
Albumin: 5 g/dL (ref 3.6–5.1)
Alkaline Phosphatase: 65 U/L (ref 40–115)
BUN: 16 mg/dL (ref 7–25)
CO2: 22 mmol/L (ref 20–31)
Calcium: 9.7 mg/dL (ref 8.6–10.3)
Chloride: 105 mmol/L (ref 98–110)
Creat: 1.05 mg/dL (ref 0.60–1.35)
Glucose, Bld: 95 mg/dL (ref 65–99)
Potassium: 4 mmol/L (ref 3.5–5.3)
Sodium: 139 mmol/L (ref 135–146)
Total Bilirubin: 0.9 mg/dL (ref 0.2–1.2)
Total Protein: 7.6 g/dL (ref 6.1–8.1)

## 2016-09-08 LAB — CBC WITH DIFFERENTIAL/PLATELET
Basophils Absolute: 0 cells/uL (ref 0–200)
Basophils Relative: 0 %
Eosinophils Absolute: 160 cells/uL (ref 15–500)
Eosinophils Relative: 2 %
HCT: 42.2 % (ref 38.5–50.0)
Hemoglobin: 15.1 g/dL (ref 13.2–17.1)
Lymphocytes Relative: 27 %
Lymphs Abs: 2160 cells/uL (ref 850–3900)
MCH: 32.5 pg (ref 27.0–33.0)
MCHC: 35.8 g/dL (ref 32.0–36.0)
MCV: 90.8 fL (ref 80.0–100.0)
MPV: 9.9 fL (ref 7.5–12.5)
Monocytes Absolute: 480 cells/uL (ref 200–950)
Monocytes Relative: 6 %
Neutro Abs: 5200 cells/uL (ref 1500–7800)
Neutrophils Relative %: 65 %
Platelets: 325 10*3/uL (ref 140–400)
RBC: 4.65 MIL/uL (ref 4.20–5.80)
RDW: 12.3 % (ref 11.0–15.0)
WBC: 8 10*3/uL (ref 3.8–10.8)

## 2016-09-08 LAB — LIPID PANEL
Cholesterol: 228 mg/dL — ABNORMAL HIGH (ref 125–200)
HDL: 39 mg/dL — ABNORMAL LOW (ref >=40)
LDL Cholesterol: 168 mg/dL — ABNORMAL HIGH (ref <130)
Total CHOL/HDL Ratio: 5.8 Ratio — ABNORMAL HIGH (ref <=5.0)
Triglycerides: 105 mg/dL (ref <150)
VLDL: 21 mg/dL (ref <30)

## 2016-09-08 LAB — TSH: TSH: 2.62 mIU/L (ref 0.40–4.50)

## 2016-09-08 MED ORDER — ALBUTEROL SULFATE HFA 108 (90 BASE) MCG/ACT IN AERS: 2.0000 | INHALATION_SPRAY | Freq: Four times a day (QID) | RESPIRATORY_TRACT | 1 refills | Status: DC | PRN

## 2016-09-08 MED ORDER — PREDNISONE 10 MG PO TABS: ORAL_TABLET | ORAL | 0 refills | Status: DC

## 2016-09-08 MED ORDER — AZITHROMYCIN 250 MG PO TABS: ORAL_TABLET | ORAL | 0 refills | Status: DC

## 2016-09-08 NOTE — Assessment & Plan Note (Addendum)
Asthma: Well controlled, except for the last 2 weeks, most likely sx exacerbated by bronchitisis. Will prescribe a Z-Pak, recommend Mucinex, albuterol every 6 hours as needed. Call if no better. Flu shot as soon as he feels better RTC  1 year, CPX

## 2016-09-08 NOTE — Progress Notes (Signed)
Pre visit review using our clinic review tool, if applicable. No additional management support is needed unless otherwise documented below in the visit note. 

## 2016-09-08 NOTE — Patient Instructions (Addendum)
GO TO THE LAB : Get the blood work     GO TO THE FRONT DESK Schedule your next appointment for a  Physical exam in 1 year   =======================================================================  Rest, fluids , tylenol  For cough:  Take Mucinex DM twice a day as needed until better   Avoid decongestants such as  Pseudoephedrine or phenylephrine   For wheezing: Use albuterol 2 puffs up to 4 times every 6 hours as needed.    Take the antibiotic as prescribed  ( Zithromax)   Take prednisone for few days  Call if not gradually better over the next  10 days  Call anytime if the symptoms are severe  ============================================================================= Once better, get a flu shot at the pharmacy

## 2016-09-08 NOTE — Progress Notes (Signed)
Subjective:    Patient ID: Roger Morgan, male    DOB: 15-Dec-1985, 30 y.o.   MRN: [id number]  DOS:  09/08/2016 Type of visit - description : cpx Interval history: Since the last time I saw him more than a year ago he has been doing well except for the last 2 weeks: Cough, postnasal dripping, nasal congestion, wheezing. His daughter had similar symptoms. Denies fever, chest pain, difficulty breathing. Has not been using any inhalers lately.   Review of Systems Constitutional: No fever. No chills. No unexplained wt changes. No unusual sweats  HEENT: No dental problems, no ear discharge, no facial swelling, no voice changes. No eye discharge, no eye  redness , no  intolerance to light   Respiratory: See above  Cardiovascular: No CP, no leg swelling , no  Palpitations  GI: Occasional epigastric burning but no nausea, no vomiting, no diarrhea . No heartburn per se.  No blood in the stools. No dysphagia, no odynophagia    Endocrine: No polyphagia, no polyuria , no polydipsia  GU: No dysuria, gross hematuria, difficulty urinating. No urinary urgency, no frequency.  Musculoskeletal: No joint swellings or unusual aches or pains  Skin: No change in the color of the skin, palor , no  Rash  Allergic, immunologic: No environmental allergies , no  food allergies  Neurological: No dizziness no  syncope. No headaches. No diplopia, no slurred, no slurred speech, no motor deficits, no facial  Numbness  Hematological: No enlarged lymph nodes, no easy bruising , no unusual bleedings  Psychiatry: No suicidal ideas, no hallucinations, no beavior problems, no confusion.  No unusual/severe anxiety, no depression   Past Medical History:  Diagnosis Date  . Allergy   . Asthma   . GERD (gastroesophageal reflux disease)     Past Surgical History:  Procedure Laterality Date  . great toe surgery     right   . HERNIA REPAIR     right    Social History   Social History  . Marital  status: Single    Spouse name: N/A  . Number of children: 2  . Years of education: N/A   Occupational History  . painter  Unemployed   Social History Main Topics  . Smoking status: Former Smoker    Quit date: 10/19/2010  . Smokeless tobacco: Never Used  . Alcohol use Yes     Comment:  rarely  . Drug use:     Types: Marijuana  . Sexual activity: Not on file   Other Topics Concern  . Not on file   Social History Narrative   [facility]           Family History  Problem Relation Age of Onset  . Ulcers Paternal Uncle   . Diabetes Other     MGM  . Breast cancer      aunt  . Colon cancer Neg Hx   . Prostate cancer Neg Hx   . CAD Neg Hx        Medication List       Accurate as of 09/08/16  2:28 PM. Always use your most recent med list.          esomeprazole 20 MG capsule Commonly known as:  NEXIUM Take 20 mg by mouth daily as needed.   fluticasone 50 MCG/ACT nasal spray Commonly known as:  FLONASE Place 2 sprays into the nose daily.   loratadine 10 MG tablet Commonly known as:  CLARITIN Take 10  mg by mouth daily.   montelukast 10 MG tablet Commonly known as:  SINGULAIR Take 1 tablet (10 mg total) by mouth at bedtime.   oseltamivir 75 MG capsule Commonly known as:  TAMIFLU Take 1 capsule (75 mg total) by mouth 2 (two) times daily.   PROAIR HFA 108 (90 Base) MCG/ACT inhaler Generic drug:  albuterol INHALE 2 PUFFS INTO THE LUNGS EVERY 6 (SIX) HOURS AS NEEDED FOR WHEEZING.          Objective:   Physical Exam BP 106/68 (BP Location: Left Arm, Patient Position: Sitting, Cuff Size: Small)   Pulse 66   Temp 97.8 F (36.6 C) (Oral)   Resp 14   Ht 6\' 1"  (1.854 m)   Wt 186 lb 4 oz (84.5 kg)   SpO2 98%   BMI 24.57 kg/m   General:   Well developed, well nourished . NAD.  Neck: No  thyromegaly  HEENT:  Normocephalic . Face symmetric, atraumatic. TMs normal, throat symmetric and not red Lungs:  Prolonged expiratory time, few rhonchi and  wheezes bilaterally with cough. Normal respiratory effort, no intercostal retractions, no accessory muscle use. Heart: RRR,  no murmur.  No pretibial edema bilaterally  Abdomen:  Not distended, soft, non-tender. No rebound or rigidity.   Skin: Exposed areas without rash. Not pale. Not jaundice Neurologic:  alert & oriented X3.  Speech normal, gait appropriate for age and unassisted Strength symmetric and appropriate for age.  Psych: Cognition and judgment appear intact.  Cooperative with normal attention span and concentration.  Behavior appropriate. No anxious or depressed appearing.    Assessment & Plan:   Assessment Asthma GERD Allergies  PLAN: Asthma: Well controlled, except for the last 2 weeks, most likely sx exacerbated by bronchitisis. Will prescribe a Z-Pak, recommend Mucinex, albuterol every 6 hours as needed. Call if no better. Flu shot as soon as he feels better RTC  1 year, CPX

## 2016-09-08 NOTE — Assessment & Plan Note (Addendum)
Td 2013 He has a healthy lifestyle, trying to stay active and eat healthy. Recommend self testicular exam Labs: CMP, CBC, FLP, TSH, HIV RTC physical exam in one year

## 2016-09-09 LAB — HIV ANTIBODY (ROUTINE TESTING W REFLEX): HIV 1&2 Ab, 4th Generation: NONREACTIVE

## 2016-10-17 ENCOUNTER — Other Ambulatory Visit: Payer: Self-pay | Admitting: Internal Medicine

## 2016-10-26 ENCOUNTER — Other Ambulatory Visit: Payer: Self-pay

## 2016-10-26 MED ORDER — ALBUTEROL SULFATE HFA 108 (90 BASE) MCG/ACT IN AERS: 2.0000 | INHALATION_SPRAY | Freq: Four times a day (QID) | RESPIRATORY_TRACT | 1 refills | Status: DC | PRN

## 2016-11-09 ENCOUNTER — Telehealth: Payer: Self-pay | Admitting: Internal Medicine

## 2016-11-09 MED ORDER — PROAIR HFA 108 (90 BASE) MCG/ACT IN AERS: 2.0000 | INHALATION_SPRAY | Freq: Four times a day (QID) | RESPIRATORY_TRACT | 5 refills | Status: DC | PRN

## 2016-11-09 NOTE — Telephone Encounter (Signed)
Rx sent 

## 2016-11-09 NOTE — Telephone Encounter (Signed)
Okay to switch to [employer]on ProductsProair------ 2 puffs every 6 hours as needed. Brand name medically necessary

## 2016-11-09 NOTE — Telephone Encounter (Signed)
Patient called stated that Dr. Drue NovelPaz prescribed an inhaler for him and he states it is not working as well for him as the Rennis Goldenroair has in the past. He would like to know if he could get the Proair prescribed instead. Please advise  Pharmacy: [id number] [place] - [address]  Patient phone: [phone]

## 2016-11-09 NOTE — Telephone Encounter (Signed)
Please advise 

## 2016-12-06 ENCOUNTER — Other Ambulatory Visit: Payer: Self-pay

## 2016-12-06 MED ORDER — PROAIR HFA 108 (90 BASE) MCG/ACT IN AERS: 2.0000 | INHALATION_SPRAY | Freq: Four times a day (QID) | RESPIRATORY_TRACT | 1 refills | Status: DC | PRN

## 2016-12-13 ENCOUNTER — Ambulatory Visit (INDEPENDENT_AMBULATORY_CARE_PROVIDER_SITE_OTHER): Payer: 59 | Admitting: Internal Medicine

## 2016-12-13 ENCOUNTER — Encounter: Payer: Self-pay | Admitting: Internal Medicine

## 2016-12-13 ENCOUNTER — Ambulatory Visit: Payer: 59 | Admitting: Internal Medicine

## 2016-12-13 VITALS — BP 124/68 | HR 105 | Temp 97.9°F | Resp 14 | Ht 73.0 in | Wt 188.0 lb

## 2016-12-13 DIAGNOSIS — B349 Viral infection, unspecified: Secondary | ICD-10-CM

## 2016-12-13 MED ORDER — BECLOMETHASONE DIPROPIONATE 40 MCG/ACT IN AERS: 2.0000 | INHALATION_SPRAY | Freq: Every day | RESPIRATORY_TRACT | 5 refills | Status: DC

## 2016-12-13 MED ORDER — OSELTAMIVIR PHOSPHATE 75 MG PO CAPS: 75.0000 mg | ORAL_CAPSULE | Freq: Two times a day (BID) | ORAL | 0 refills | Status: DC

## 2016-12-13 MED ORDER — PROAIR HFA 108 (90 BASE) MCG/ACT IN AERS: 2.0000 | INHALATION_SPRAY | Freq: Four times a day (QID) | RESPIRATORY_TRACT | 5 refills | Status: DC | PRN

## 2016-12-13 NOTE — Patient Instructions (Signed)
Rest, fluids , tylenol  For cough:  Take Mucinex DM twice a day as needed until better  Asthma: Qvar daily Albuterol up to 4 times a day  For nasal congestion: Use OTC Nasocort or Flonase : 2 nasal sprays on each side of the nose in the morning until you feel better   Avoid decongestants such as  Pseudoephedrine or phenylephrine     Take  Tamiflu twice a day for 5 days  Call if not gradually better over the next  10 days  Call anytime if the symptoms are severe; also call if you better and then your symptoms come back

## 2016-12-13 NOTE — Progress Notes (Signed)
Pre visit review using our clinic review tool, if applicable. No additional management support is needed unless otherwise documented below in the visit note. 

## 2016-12-13 NOTE — Progress Notes (Signed)
Subjective:    Patient ID: Roger Morgan, male    DOB: 11-Sep-1986, 31 y.o.   MRN: [id number]  DOS:  12/13/2016 Type of visit - description : acute Interval history: Symptoms started yesterday, rather sudden-- nasal congestion, green nasal discharge, worsening asthma with more cough and wheezing than baseline, has use albuterol several times. Had a temperature of 101. The day prior he had some nausea, no vomiting and some diarrhea. took some Advil, it broke the fever but he is not feeling any better. He was in [place] visiting family, does not recall any sick contacts.   Review of Systems  No chest pain or difficulty breathing per se denies headaches or rash Does some mild generalized myalgias.  Past Medical History:  Diagnosis Date  . Allergy   . Asthma   . GERD (gastroesophageal reflux disease)     Past Surgical History:  Procedure Laterality Date  . great toe surgery     right   . HERNIA REPAIR     right    Social History   Social History  . Marital status: Married    Spouse name: N/A  . Number of children: 2  . 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 Smoking status: Former Smoker    Quit date: 10/19/2010  . Smokeless tobacco: Never Used  . Alcohol use Yes     Comment:  rarely  . Drug use:     Types: Marijuana  . Sexual activity: Not on file   Other Topics Concern  . Not on file   Social History Narrative   Originally from [place], [place]            Allergies as of 12/13/2016      Reactions   Avocado Other (See Comments)   Sore throat   Banana Itching      Medication List       Accurate as of 12/13/16 11:59 PM. Always use your most recent med list.          beclomethasone 40 MCG/ACT inhaler Commonly known as:  QVAR Inhale 2 puffs into the lungs daily.   esomeprazole 20 MG capsule Commonly known as:  NEXIUM Take 20 mg by mouth daily as needed.   oseltamivir 75 MG capsule Commonly  known as:  TAMIFLU Take 1 capsule (75 mg total) by mouth 2 (two) times daily.   PROAIR HFA 108 (90 Base) MCG/ACT inhaler Generic drug:  albuterol Inhale 2 puffs into the lungs every 6 (six) hours as needed for wheezing or shortness of breath.          Objective:   Physical Exam BP 124/68 (BP Location: Left Arm, Patient Position: Sitting, Cuff Size: Normal)   Pulse (!) 105   Temp 97.9 F (36.6 C) (Oral)   Resp 14   Ht 6\' 1"  (1.854 m)   Wt 188 lb (85.3 kg)   SpO2 96%   BMI 24.80 kg/m  General:   Well developed, well nourished, nontoxic appearing. No major distress.  HEENT:  Normocephalic . Face symmetric, atraumatic. TMs: No red bulge, some fluid levels. Throat symmetric, no red no discharge. Nose quite congested, sinuses slightly TTP bilaterally. Lungs:  Rhonchi with cough. No crackles Normal respiratory effort, no intercostal retractions, no accessory muscle use. Heart: RRR,  no murmur.  No pretibial edema bilaterally  Skin: Not pale. Not jaundice Neurologic:  alert & oriented X3.  Speech  normal, gait appropriate for age and unassisted Psych--  Cognition and judgment appear intact.  Cooperative with normal attention span and concentration.  Behavior appropriate. No anxious or depressed appearing.      Assessment & Plan:   Assessment Asthma GERD Allergies  PLAN: Viral syndrome: Sx c/w a viral syndrome, possibly the flu given sudden onset. We don't have a rapid flu test at the office. Will treat him empirically with Tamiflu. Also refill his asthma medication. Encouraged to call me if he is not improving or if symptoms improve and then come back. Patient verbalized understanding. See AVS

## 2016-12-14 NOTE — Assessment & Plan Note (Signed)
Viral syndrome: Sx c/w a viral syndrome, possibly the flu given sudden onset. We don't have a rapid flu test at the office. Will treat him empirically with Tamiflu. Also refill his asthma medication. Encouraged to call me if he is not improving or if symptoms improve and then come back. Patient verbalized understanding. See AVS

## 2017-02-07 ENCOUNTER — Other Ambulatory Visit: Payer: Self-pay

## 2017-02-07 MED ORDER — PROAIR HFA 108 (90 BASE) MCG/ACT IN AERS: 2.0000 | INHALATION_SPRAY | Freq: Four times a day (QID) | RESPIRATORY_TRACT | 1 refills | Status: DC | PRN

## 2017-04-23 ENCOUNTER — Other Ambulatory Visit: Payer: Self-pay

## 2017-04-23 MED ORDER — PROAIR HFA 108 (90 BASE) MCG/ACT IN AERS: 2.0000 | INHALATION_SPRAY | Freq: Four times a day (QID) | RESPIRATORY_TRACT | 1 refills | Status: DC | PRN

## 2017-09-11 ENCOUNTER — Ambulatory Visit (INDEPENDENT_AMBULATORY_CARE_PROVIDER_SITE_OTHER): Payer: 59 | Admitting: Internal Medicine

## 2017-09-11 ENCOUNTER — Encounter: Payer: Self-pay | Admitting: Internal Medicine

## 2017-09-11 VITALS — BP 112/78 | HR 81 | Temp 98.1°F | Resp 14 | Ht 73.0 in | Wt 193.1 lb

## 2017-09-11 DIAGNOSIS — L989 Disorder of the skin and subcutaneous tissue, unspecified: Secondary | ICD-10-CM

## 2017-09-11 DIAGNOSIS — F419 Anxiety disorder, unspecified: Secondary | ICD-10-CM

## 2017-09-11 DIAGNOSIS — Z0000 Encounter for general adult medical examination without abnormal findings: Secondary | ICD-10-CM

## 2017-09-11 DIAGNOSIS — J45901 Unspecified asthma with (acute) exacerbation: Secondary | ICD-10-CM | POA: Diagnosis not present

## 2017-09-11 DIAGNOSIS — Z0001 Encounter for general adult medical examination with abnormal findings: Secondary | ICD-10-CM | POA: Diagnosis not present

## 2017-09-11 DIAGNOSIS — K219 Gastro-esophageal reflux disease without esophagitis: Secondary | ICD-10-CM

## 2017-09-11 LAB — LIPID PANEL
Cholesterol: 231 mg/dL — ABNORMAL HIGH (ref 0–200)
HDL: 46.6 mg/dL (ref >39.00)
NonHDL: 183.91
Total CHOL/HDL Ratio: 5
Triglycerides: 311 mg/dL — ABNORMAL HIGH (ref 0.0–149.0)
VLDL: 62.2 mg/dL — ABNORMAL HIGH (ref 0.0–40.0)

## 2017-09-11 LAB — LDL CHOLESTEROL, DIRECT: Direct LDL: 129 mg/dL

## 2017-09-11 MED ORDER — PREDNISONE 10 MG PO TABS: ORAL_TABLET | ORAL | 0 refills | Status: DC

## 2017-09-11 MED ORDER — PROAIR HFA 108 (90 BASE) MCG/ACT IN AERS: 2.0000 | INHALATION_SPRAY | Freq: Four times a day (QID) | RESPIRATORY_TRACT | 3 refills | Status: DC | PRN

## 2017-09-11 MED ORDER — AZITHROMYCIN 250 MG PO TABS: ORAL_TABLET | ORAL | 0 refills | Status: DC

## 2017-09-11 NOTE — Patient Instructions (Signed)
GO TO THE LAB : Get the blood work     GO TO THE FRONT DESK Schedule your next appointment for a  physical exam in one year    Albuterol as needed for cough, wheezing and chest congestion  Prednisone as prescribed  Zithromax as prescribed  Mucinex DM 1 tablet twice a day until better.  Call if not improving in the next few days  Call if you are  using albuterol more than 3 or 4 times a week on regular basis.

## 2017-09-11 NOTE — Assessment & Plan Note (Addendum)
-  Td 2013. Recommend a flu shot when he is better from his asthma exacerbation -does practice [facility], saw urology d/t infertility, was told all was ok -Labs reviewed, due for an FLP -Diet and exercise discussed

## 2017-09-11 NOTE — Progress Notes (Signed)
Subjective:    Patient ID: Roger Morgan, male    DOB: July 14, 1986, 31 y.o.   MRN: [id number]  DOS:  09/11/2017 Type of visit - description : cpx Interval history:  In general feeling well but about 2 weeks ago his daughter got sick with a URI and then the patient himself got sick: Cough, mild, + chest congestion, wheezing, he feels there is mucus in the chest but can bring it out. Using albuterol more frequently.   Review of Systems  Denies fever chills. No sinus congestion GERD symptoms well controlled PPIs No chest pain, mild shortness of breath with chest congestion. Also, noted a skin lesion at the left ankle, has been there for about 3 years and has not changed.  Other than above, a 14 point review of systems is negative     Past Medical History:  Diagnosis Date  . Allergy   . Asthma   . GERD (gastroesophageal reflux disease)     Past Surgical History:  Procedure Laterality Date  . great toe surgery     right   . HERNIA REPAIR     right    Social History   Social History  . Marital status: Married    Spouse name: N/A  . Number of children: 2  . 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 Smoking status: Former Smoker    Quit date: 10/19/2010  . Smokeless tobacco: Never Used  . Alcohol use Yes     Comment:  rarely  . Drug use: Yes    Types: Marijuana     Comment: not daily  . Sexual activity: Not on file   Other Topics Concern  . Not on file   Social History Narrative   Originally from [place], [place]           Family History  Problem Relation Age of Onset  . Ulcers Paternal Uncle   . Diabetes Other        MGM  . Breast cancer Unknown        aunt  . Colon cancer Neg Hx   . Prostate cancer Neg Hx   . CAD Neg Hx      Allergies as of 09/11/2017      Reactions   Avocado Other (See Comments)   Sore throat   Banana Itching      Medication List       Accurate as of 09/11/17 11:59 PM.  Always use your most recent med list.          azithromycin 250 MG tablet Commonly known as:  ZITHROMAX Z-PAK 2 tabs a day the first day, then 1 tab a day x 4 days   esomeprazole 20 MG capsule Commonly known as:  NEXIUM Take 20 mg by mouth daily as needed.   predniSONE 10 MG tablet Commonly known as:  DELTASONE 4 tablets x 2 days, 3 tabs x 2 days, 2 tabs x 2 days, 1 tab x 2 days   PROAIR HFA 108 (90 Base) MCG/ACT inhaler Generic drug:  albuterol Inhale 2 puffs into the lungs every 6 (six) hours as needed for wheezing or shortness of breath.          Objective:   Physical Exam  Musculoskeletal:       Feet:   BP 112/78 (BP Location: Left Arm, Patient Position: Sitting, Cuff Size: Small)   Pulse 81  Temp 98.1 F (36.7 C) (Oral)   Resp 14   Ht  (1.854 m)   Wt 193 lb 2 oz (87.6 kg)   SpO2 98%   BMI 25.48 kg/m   General:   Well developed, well nourished . NAD.  Neck: No  thyromegaly  HEENT:  Normocephalic . Face symmetric, atraumatic. Nose not congested, sinuses no TTP Lungs:  Few rhonchi, + large airway congestion Normal respiratory effort, no intercostal retractions, no accessory muscle use. Heart: RRR,  no murmur.  No pretibial edema bilaterally  Abdomen:  Not distended, soft, non-tender. No rebound or rigidity.   Neurologic:  alert & oriented X3.  Speech normal, gait appropriate for age and unassisted Strength symmetric and appropriate for age.  Psych: Cognition and judgment appear intact.  Cooperative with normal attention span and concentration.  Behavior appropriate. No anxious or depressed appearing.    Assessment & Plan:    Assessment Asthma GERD Allergies  PLAN: Asthma: Has not taken Qvar and while, sxs are usually controlled, once a year he get an exacerbation. Latest exacerbation started approximately 2 weeks ago. He declines to restart Qvar since he feels well most of the time. We agreed to prescribe Zithromax and prednisone for  his current exacerbation. Cont  albuterol prn GERD: Well controlled Skin lesion: started after he had a ankle fracture and used a boot. Etiology unclear, refer to dermatology RTC one year, sooner if his asthma is not well-controlled.

## 2017-09-11 NOTE — Progress Notes (Signed)
Pre visit review using our clinic review tool, if applicable. No additional management support is needed unless otherwise documented below in the visit note. 

## 2017-09-12 NOTE — Assessment & Plan Note (Signed)
Asthma: Has not taken Qvar and while, sxs are usually controlled, once a year he get an exacerbation. Latest exacerbation started approximately 2 weeks ago. He declines to restart Qvar since he feels well most of the time. We agreed to prescribe Zithromax and prednisone for his current exacerbation. Cont  albuterol prn GERD: Well controlled Skin lesion: started after he had a ankle fracture and used a boot. Etiology unclear, refer to dermatology RTC one year, sooner if his asthma is not well-controlled.

## 2017-09-20 ENCOUNTER — Telehealth: Payer: Self-pay | Admitting: Internal Medicine

## 2017-09-20 MED ORDER — PROAIR HFA 108 (90 BASE) MCG/ACT IN AERS: 2.0000 | INHALATION_SPRAY | Freq: Four times a day (QID) | RESPIRATORY_TRACT | 3 refills | Status: DC | PRN

## 2017-09-20 NOTE — Telephone Encounter (Signed)
Caller name: Relation to XB:JYNW Call back number:[phone] Pharmacy:[facility]- mail order  Reason for call: pt is needing rx for PROAIR HFA 108 (90 Base) MCG/ACT inhaler, 90 day supply sent to mail order.

## 2017-09-20 NOTE — Telephone Encounter (Signed)
Rx sent 

## 2017-09-26 ENCOUNTER — Telehealth: Payer: Self-pay

## 2017-09-26 MED ORDER — PROAIR HFA 108 (90 BASE) MCG/ACT IN AERS: 2.0000 | INHALATION_SPRAY | Freq: Four times a day (QID) | RESPIRATORY_TRACT | 3 refills | Status: DC | PRN

## 2017-09-26 NOTE — Telephone Encounter (Signed)
[facility] did not receive refill on Proair from 09/20/2017, requesting verbal orders---> verbal orders given for #3 inhalers (90 day supply) and 3 refills.

## 2017-11-22 ENCOUNTER — Encounter: Payer: Self-pay | Admitting: Internal Medicine

## 2017-11-22 ENCOUNTER — Ambulatory Visit: Payer: 59 | Admitting: Internal Medicine

## 2017-11-22 VITALS — BP 122/80 | HR 89 | Temp 98.1°F | Resp 14 | Ht 73.0 in | Wt 213.0 lb

## 2017-11-22 DIAGNOSIS — J029 Acute pharyngitis, unspecified: Secondary | ICD-10-CM

## 2017-11-22 DIAGNOSIS — J069 Acute upper respiratory infection, unspecified: Secondary | ICD-10-CM

## 2017-11-22 LAB — POCT RAPID STREP A (OFFICE): Rapid Strep A Screen: NEGATIVE

## 2017-11-22 MED ORDER — AZELASTINE HCL 0.1 % NA SOLN: 2.0000 | Freq: Every evening | NASAL | 3 refills | Status: AC | PRN

## 2017-11-22 MED ORDER — PROAIR HFA 108 (90 BASE) MCG/ACT IN AERS: 2.0000 | INHALATION_SPRAY | Freq: Four times a day (QID) | RESPIRATORY_TRACT | 3 refills | Status: DC | PRN

## 2017-11-22 NOTE — Progress Notes (Signed)
Subjective:    Patient ID: Roger Morgan, male    DOB: August 03, 1986, 31 y.o.   MRN: [id number]  DOS:  11/22/2017 Type of visit - description : acute Interval history: Symptoms started 2 days ago with mild sore throat, yesterday he also developed some sinus pressure and congestion with nasal discharge. Asthma is a slightly exacerbated, has been using his albuterol twice a day.   Review of Systems No Fever chills No nausea or vomiting   Past Medical History:  Diagnosis Date  . Allergy   . Asthma   . GERD (gastroesophageal reflux disease)     Past Surgical History:  Procedure Laterality Date  . great toe surgery     right   . HERNIA REPAIR     right    Social History   Socioeconomic History  . Marital status: Married    Spouse name: Not on file  . Number of children: 2  . Years of education: Not on file  . Highest education level: Not on file  Social Needs  . Financial resource strain: Not on file  . Food insecurity - worry: Not on file  . Food insecurity - inability: Not on file  . Transportation needs - medical: Not on file  . Transportation needs - non-medical: Not on file  Occupational History  . Occupation: Education administratorpainter   Tobacco Use  . Smoking status: Former Smoker    Last attempt to quit: 10/19/2010    Years since quitting: 7.0  . Smokeless tobacco: Never Used  Substance and Sexual Activity  . Alcohol use: Yes    Comment:  rarely  . Drug use: Yes    Types: Marijuana    Comment: not daily  . Sexual activity: Not on file  Other Topics Concern  . Not on file  Social History Narrative   Originally from [place]         Allergies as of 11/22/2017      Reactions   Avocado Other (See Comments)   Sore throat   Banana Itching      Medication List        Accurate as of 11/22/17  5:10 PM. Always use your most recent med list.          azelastine 0.1 % nasal spray Commonly known as:  ASTELIN Place 2 sprays into both nostrils at bedtime as  needed for rhinitis. Use in each nostril as directed   esomeprazole 20 MG capsule Commonly known as:  NEXIUM Take 20 mg by mouth daily as needed.   PROAIR HFA 108 (90 Base) MCG/ACT inhaler Generic drug:  albuterol Inhale 2 puffs into the lungs every 6 (six) hours as needed for wheezing or shortness of breath.          Objective:   Physical Exam BP 122/80 (BP Location: Left Arm, Patient Position: Sitting, Cuff Size: Normal)   Pulse 89   Temp 98.1 F (36.7 C) (Oral)   Resp 14   Ht 6\' 1"  (1.854 m)   Wt 213 lb (96.6 kg)   SpO2 97%   BMI 28.10 kg/m  General:   Well developed, well nourished . NAD.  HEENT:  Normocephalic . Face symmetric, atraumatic TMs normal, nose is slightly congested, sinuses non-TTP.  Throat: Symmetric, slightly red, no discharge. Lungs:  No rhonchi, few wheezes bilaterally. Normal respiratory effort, no intercostal retractions, no accessory muscle use. Heart: RRR,  no murmur.  No pretibial edema bilaterally  Skin: Not pale. Not jaundice  Neurologic:  alert & oriented X3.  Speech normal, gait appropriate for age and unassisted Psych--  Cognition and judgment appear intact.  Cooperative with normal attention span and concentration.  Behavior appropriate. No anxious or depressed appearing.      Assessment & Plan:   Assessment Asthma GERD Allergies  PLAN: URI, mild asthma exacerbation: Strep test negative, likely a viral URI. Plan: Conservative treatment with Flonase, Astelin, Mucinex and albuterol.  Encourage use of saline spray.  If no better will call, consider an antibiotic and/or prednisone.

## 2017-11-22 NOTE — Progress Notes (Signed)
Pre visit review using our clinic review tool, if applicable. No additional management support is needed unless otherwise documented below in the visit note. 

## 2017-11-22 NOTE — Patient Instructions (Signed)
Rest, fluids , tylenol  For cough:  Take Mucinex DM twice a day as needed until better  For nasal congestion: Use OTC   Flonase : 2 nasal sprays on each side of the nose in the morning until you feel better Use ASTELIN a prescribed spray : 2 nasal sprays on each side of the nose at night until you feel better  Use your albuterol inhaler needed.   Call if not gradually better over the next  5 days  Call anytime if the symptoms are severe, you have high fever, short of breath, chest pain

## 2017-11-22 NOTE — Assessment & Plan Note (Signed)
URI, mild asthma exacerbation: Strep test negative, likely a viral URI. Plan: Conservative treatment with Flonase, Astelin, Mucinex and albuterol.  Encourage use of saline spray.  If no better will call, consider an antibiotic and/or prednisone.

## 2017-11-23 ENCOUNTER — Ambulatory Visit: Payer: 59 | Admitting: Internal Medicine

## 2018-02-21 ENCOUNTER — Encounter: Payer: Self-pay | Admitting: Internal Medicine

## 2018-02-21 ENCOUNTER — Ambulatory Visit: Payer: Managed Care, Other (non HMO) | Admitting: Internal Medicine

## 2018-02-21 VITALS — BP 128/78 | HR 91 | Temp 97.8°F | Resp 16 | Ht 73.0 in | Wt 211.4 lb

## 2018-02-21 DIAGNOSIS — J4541 Moderate persistent asthma with (acute) exacerbation: Secondary | ICD-10-CM | POA: Diagnosis not present

## 2018-02-21 MED ORDER — BUDESONIDE-FORMOTEROL FUMARATE 160-4.5 MCG/ACT IN AERO: 2.0000 | INHALATION_SPRAY | Freq: Two times a day (BID) | RESPIRATORY_TRACT | 3 refills | Status: DC

## 2018-02-21 MED ORDER — PREDNISONE 10 MG PO TABS: ORAL_TABLET | ORAL | 0 refills | Status: DC

## 2018-02-21 MED ORDER — MONTELUKAST SODIUM 10 MG PO TABS: 10.0000 mg | ORAL_TABLET | Freq: Every day | ORAL | 3 refills | Status: DC

## 2018-02-21 MED ORDER — PROAIR HFA 108 (90 BASE) MCG/ACT IN AERS: 2.0000 | INHALATION_SPRAY | Freq: Four times a day (QID) | RESPIRATORY_TRACT | 5 refills | Status: DC | PRN

## 2018-02-21 NOTE — Patient Instructions (Addendum)
Please come back in 1 month for a checkup.  Make an appointment  Symbicort 2 puffs twice a day every day regardless of your symptoms  Albuterol: 2 puffs every 4 hours as needed for cough, wheezing. You can also use it before you play sports  Singulair 1 tablet daily  Prednisone for several days as prescribed  Use a mask if you are in a dusty environment  ER if you have severe symptoms, shortness of breath, chest pain  (not responding to albuterol)

## 2018-02-21 NOTE — Progress Notes (Signed)
Subjective:    Patient ID: Roger Morgan, male    DOB: 1986-07-14, 32 y.o.   MRN: [id number]  DOS:  02/21/2018 Type of visit - description : Acute Interval history: Asthma has not been well controlled for at least 2-1/2 months. Starting this year, he has been using his albuterol once or twice a day due to cough and wheezing. Symptoms got much worse in the last 2 weeks: More frequent cough, wheezing, symptoms wake him up most nights. Was  playing baseball yesterday and  shortly after he started to exercise he started to wheeze, it was moderate to severe for a few hours.   Review of Systems Denies fever chills, no recent URI symptoms. Patient is not smoking. GERD well controlled. Denies any itchy nose, runny nose, sneezing. He is a Education administratorpainter, from time to time works in dusty environments.  At home there is no new things that could have hip pain (no new pets, carpets etc.)  Past Medical History:  Diagnosis Date  . Allergy   . Asthma   . GERD (gastroesophageal reflux disease)     Past Surgical History:  Procedure Laterality Date  . great toe surgery     right   . HERNIA REPAIR     right    Social History   Socioeconomic History  . Marital status: Married    Spouse name: Not on file  . Number of children: 2  . Years of education: Not on file  . Highest education level: Not on file  Social Needs  . Financial resource strain: Not on file  . Food insecurity - worry: Not on file  . Food insecurity - inability: Not on file  . Transportation needs - medical: Not on file  . Transportation needs - non-medical: Not on file  Occupational History  . Occupation: Education administratorpainter   Tobacco Use  . Smoking status: Former Smoker    Last attempt to quit: 10/19/2010    Years since quitting: 7.3  . Smokeless tobacco: Never Used  Substance and Sexual Activity  . Alcohol use: Yes    Comment:  rarely  . Drug use: Yes    Types: Marijuana    Comment: not daily  . Sexual activity: Not on file   Other Topics Concern  . Not on file  Social History Narrative   Originally from [place]         Allergies as of 02/21/2018      Reactions   Avocado Other (See Comments)   Sore throat   Banana Itching      Medication List        Accurate as of 02/21/18  4:17 PM. Always use your most recent med list.          azelastine 0.1 % nasal spray Commonly known as:  ASTELIN Place 2 sprays into both nostrils at bedtime as needed for rhinitis. Use in each nostril as directed   esomeprazole 20 MG capsule Commonly known as:  NEXIUM Take 20 mg by mouth daily as needed.   PROAIR HFA 108 (90 Base) MCG/ACT inhaler Generic drug:  albuterol Inhale 2 puffs into the lungs every 6 (six) hours as needed for wheezing or shortness of breath.          Objective:   Physical Exam BP 128/78 (BP Location: Left Arm, Patient Position: Sitting, Cuff Size: Small)   Pulse 91   Temp 97.8 F (36.6 C) (Oral)   Resp 16   Ht 6\' 1"  (  1.854 m)   Wt 211 lb 6 oz (95.9 kg)   SpO2 96%   BMI 27.89 kg/m  General:   Well developed, well nourished . NAD.  HEENT:  Normocephalic . Face symmetric, atraumatic TMs normal, nose not congested, throat symmetric, no red Lungs:  Mild wheezing bilaterally, few rhonchi.  No crackles.  No increased work of breathing Normal respiratory effort, no intercostal retractions, no accessory muscle use. Heart: RRR,  no murmur.  No pretibial edema bilaterally  Skin: Not pale. Not jaundice Neurologic:  alert & oriented X3.  Speech normal, gait appropriate for age and unassisted Psych--  Cognition and judgment appear intact.  Cooperative with normal attention span and concentration.  Behavior appropriate. No anxious or depressed appearing.      Assessment & Plan:   Assessment Asthma GERD Allergies  PLAN: Asthma: Well controlled up until December 2018, for the last 2-1/2 months symptoms have increased to daily, much worse in the last 2 weeks. No clear-cut  trigger for his symptoms. Plan: Prednisone for a few days.  Add Symbicort, Singulair. Continue albuterol as needed, also okay to use it before exercise. ER if symptoms severe despite treatment.  Refer to an allergist if not improving. RTC 2 months.

## 2018-02-21 NOTE — Progress Notes (Signed)
Pre visit review using our clinic review tool, if applicable. No additional management support is needed unless otherwise documented below in the visit note. 

## 2018-02-22 ENCOUNTER — Ambulatory Visit: Payer: 59 | Admitting: Internal Medicine

## 2018-02-22 NOTE — Assessment & Plan Note (Signed)
Asthma: Well controlled up until December 2018, for the last 2-1/2 months symptoms have increased to daily, much worse in the last 2 weeks. No clear-cut trigger for his symptoms. Plan: Prednisone for a few days.  Add Symbicort, Singulair. Continue albuterol as needed, also okay to use it before exercise. ER if symptoms severe despite treatment.  Refer to an allergist if not improving. RTC 2 months.

## 2018-03-07 ENCOUNTER — Ambulatory Visit: Payer: Self-pay | Admitting: Podiatry

## 2018-03-26 ENCOUNTER — Ambulatory Visit: Payer: Managed Care, Other (non HMO) | Admitting: Internal Medicine

## 2018-03-26 DIAGNOSIS — Z0289 Encounter for other administrative examinations: Secondary | ICD-10-CM

## 2018-04-01 ENCOUNTER — Ambulatory Visit: Payer: Managed Care, Other (non HMO) | Admitting: Internal Medicine

## 2018-04-01 DIAGNOSIS — Z0289 Encounter for other administrative examinations: Secondary | ICD-10-CM

## 2018-04-03 ENCOUNTER — Encounter: Payer: Self-pay | Admitting: Internal Medicine

## 2018-04-03 ENCOUNTER — Ambulatory Visit: Payer: Managed Care, Other (non HMO) | Admitting: Internal Medicine

## 2018-04-03 VITALS — BP 118/76 | HR 84 | Temp 97.3°F | Resp 16 | Ht 73.0 in | Wt 216.1 lb

## 2018-04-03 DIAGNOSIS — J301 Allergic rhinitis due to pollen: Secondary | ICD-10-CM | POA: Diagnosis not present

## 2018-04-03 DIAGNOSIS — J4520 Mild intermittent asthma, uncomplicated: Secondary | ICD-10-CM | POA: Diagnosis not present

## 2018-04-03 NOTE — Patient Instructions (Signed)
  GO TO THE FRONT DESK Schedule your next appointment for a  Physical exam by 09/2018  Decrease Symbicort to 1 puff twice a day

## 2018-04-03 NOTE — Assessment & Plan Note (Signed)
Asthma: See last visit, s/p prednisone, added Symbicort and Singulair.  Much improved.  Has not used his rescue inhaler in a while. Recommend to change Symbicort to Qvar, he is somewhat reluctant because in his experience Symbicort works better. We agreed to decrease Symbicort to 1 puff B.I.D., okay to go back to 2 puff B.I.D. if needed.  Continue other medications. Allergies: Under better control. RTC 09-2018 CPX

## 2018-04-03 NOTE — Progress Notes (Signed)
Pre visit review using our clinic review tool, if applicable. No additional management support is needed unless otherwise documented below in the visit note. 

## 2018-04-03 NOTE — Progress Notes (Signed)
Subjective:    Patient ID: Roger Morgan, male    DOB: 06-Jul-1986, 32 y.o.   MRN: [id number]  DOS:  04/03/2018 Type of visit - description : f/u Interval history: See last visit, asthma was not well controlled, had prednisone, Symbicort added.  Good compliance, no apparent side effects. Doing better, has not used his rescue inhaler in few weeks.   Review of Systems Denies fever, chills. No cough or wheezing recently.  No sputum production.  Past Medical History:  Diagnosis Date  . Allergy   . Asthma   . GERD (gastroesophageal reflux disease)     Past Surgical History:  Procedure Laterality Date  . great toe surgery     right   . HERNIA REPAIR     right    Social History   Socioeconomic History  . Marital status: Married    Spouse name: Not on file  . Number of children: 2  . Years of education: Not on file  . Highest education level: Not on file  Occupational History  . Occupation: Education administratorpainter   Social Needs  . Financial resource strain: Not on file  . Food insecurity:    Worry: Not on file    Inability: Not on file  . Transportation needs:    Medical: Not on file    Non-medical: Not on file  Tobacco Use  . Smoking status: Former Smoker    Last attempt to quit: 10/19/2010    Years since quitting: 7.4  . Smokeless tobacco: Never Used  Substance and Sexual Activity  . Alcohol use: Yes    Comment:  rarely  . Drug use: Yes    Types: Marijuana    Comment: not daily  . Sexual activity: Not on file  Lifestyle  . Physical activity:    Days per week: Not on file    Minutes per session: Not on file  . Stress: Not on file  Relationships  . Social connections:    Talks on phone: Not on file    Gets together: Not on file    Attends religious service: Not on file    Active member of club or organization: Not on file    Attends meetings of clubs or organizations: Not on file    Relationship status: Not on file  . Intimate partner violence:    Fear of current  or ex partner: Not on file    Emotionally abused: Not on file    Physically abused: Not on file    Forced sexual activity: Not on file  Other Topics Concern  . Not on file  Social History Narrative   Originally from [place]         Allergies as of 04/03/2018      Reactions   Avocado Other (See Comments)   Sore throat   Banana Itching      Medication List        Accurate as of 04/03/18  4:59 PM. Always use your most recent med list.          azelastine 0.1 % nasal spray Commonly known as:  ASTELIN Place 2 sprays into both nostrils at bedtime as needed for rhinitis. Use in each nostril as directed   budesonide-formoterol 160-4.5 MCG/ACT inhaler Commonly known as:  SYMBICORT Inhale 2 puffs into the lungs 2 (two) times daily.   esomeprazole 20 MG capsule Commonly known as:  NEXIUM Take 20 mg by mouth daily as needed.   montelukast 10 MG  tablet Commonly known as:  SINGULAIR Take 1 tablet (10 mg total) by mouth at bedtime.   PROAIR HFA 108 (90 Base) MCG/ACT inhaler Generic drug:  albuterol Inhale 2 puffs into the lungs every 6 (six) hours as needed for wheezing or shortness of breath.          Objective:   Physical Exam BP 118/76 (BP Location: Left Arm, Patient Position: Sitting, Cuff Size: Normal)   Pulse 84   Temp (!) 97.3 F (36.3 C) (Oral)   Resp 16   Ht 6\' 1"  (1.854 m)   Wt 216 lb 2 oz (98 kg)   SpO2 96%   BMI 28.51 kg/m  General:   Well developed, well nourished . NAD.  HEENT:  Normocephalic . Face symmetric, atraumatic Lungs:  CTA B Normal respiratory effort, no intercostal retractions, no accessory muscle use. Heart: RRR,  no murmur.  No pretibial edema bilaterally  Skin: Not pale. Not jaundice Neurologic:  alert & oriented X3.  Speech normal, gait appropriate for age and unassisted Psych--  Cognition and judgment appear intact.  Cooperative with normal attention span and concentration.  Behavior appropriate. No anxious or depressed  appearing.      Assessment & Plan:    Assessment Asthma GERD Allergies  PLAN: Asthma: See last visit, s/p prednisone, added Symbicort and Singulair.  Much improved.  Has not used his rescue inhaler in a while. Recommend to change Symbicort to Qvar, he is somewhat reluctant because in his experience Symbicort works better. We agreed to decrease Symbicort to 1 puff B.I.D., okay to go back to 2 puff B.I.D. if needed.  Continue other medications. Allergies: Under better control. RTC 09-2018 CPX

## 2018-04-24 ENCOUNTER — Other Ambulatory Visit: Payer: Self-pay | Admitting: Internal Medicine

## 2018-04-25 ENCOUNTER — Telehealth: Payer: Self-pay

## 2018-04-25 NOTE — Telephone Encounter (Signed)
Copied from CRM [id number]. Topic: Bill or Statement - Patient/Guarantor Inquiry >> Apr 25, 2018 12:52 PM Terisa Starr wrote: Patient states that he was sent a $50 fee no show fee for appointment 4/16. He said that he had to cancel because of his job. I advised patient that he canceled within 24 hours and that is policy for the practice. He still asked me to send a message to Dr Drue Novel, Call back [phone]

## 2018-04-25 NOTE — Telephone Encounter (Signed)
Please advise 

## 2018-04-27 NOTE — Telephone Encounter (Signed)
Ok to waive 

## 2018-04-29 NOTE — Telephone Encounter (Signed)
OK, Will submit to billing dept.

## 2018-04-30 ENCOUNTER — Other Ambulatory Visit: Payer: Self-pay

## 2018-04-30 MED ORDER — MONTELUKAST SODIUM 10 MG PO TABS: 10.0000 mg | ORAL_TABLET | Freq: Every day | ORAL | 1 refills | Status: DC

## 2018-04-30 MED ORDER — BUDESONIDE-FORMOTEROL FUMARATE 160-4.5 MCG/ACT IN AERO: 2.0000 | INHALATION_SPRAY | Freq: Two times a day (BID) | RESPIRATORY_TRACT | 1 refills | Status: DC

## 2018-06-13 ENCOUNTER — Other Ambulatory Visit: Payer: Self-pay

## 2018-06-13 ENCOUNTER — Encounter (HOSPITAL_BASED_OUTPATIENT_CLINIC_OR_DEPARTMENT_OTHER): Payer: Self-pay | Admitting: *Deleted

## 2018-06-13 ENCOUNTER — Emergency Department (HOSPITAL_BASED_OUTPATIENT_CLINIC_OR_DEPARTMENT_OTHER)
Admission: EM | Admit: 2018-06-13 | Discharge: 2018-06-13 | Disposition: A | Discharge status: Home / Self Care | Payer: Managed Care, Other (non HMO) | Attending: Emergency Medicine | Admitting: Emergency Medicine

## 2018-06-13 DIAGNOSIS — S61211A Laceration without foreign body of left index finger without damage to nail, initial encounter: Secondary | ICD-10-CM | POA: Diagnosis present

## 2018-06-13 DIAGNOSIS — Z87891 Personal history of nicotine dependence: Secondary | ICD-10-CM | POA: Diagnosis not present

## 2018-06-13 DIAGNOSIS — W25XXXA Contact with sharp glass, initial encounter: Secondary | ICD-10-CM | POA: Diagnosis not present

## 2018-06-13 DIAGNOSIS — J45909 Unspecified asthma, uncomplicated: Secondary | ICD-10-CM | POA: Diagnosis not present

## 2018-06-13 DIAGNOSIS — Y9289 Other specified places as the place of occurrence of the external cause: Secondary | ICD-10-CM | POA: Diagnosis not present

## 2018-06-13 DIAGNOSIS — Z23 Encounter for immunization: Secondary | ICD-10-CM | POA: Insufficient documentation

## 2018-06-13 DIAGNOSIS — Y999 Unspecified external cause status: Secondary | ICD-10-CM | POA: Insufficient documentation

## 2018-06-13 DIAGNOSIS — Y9389 Activity, other specified: Secondary | ICD-10-CM | POA: Diagnosis not present

## 2018-06-13 DIAGNOSIS — Z79899 Other long term (current) drug therapy: Secondary | ICD-10-CM | POA: Diagnosis not present

## 2018-06-13 MED ORDER — TETANUS-DIPHTH-ACELL PERTUSSIS 5-2.5-18.5 LF-MCG/0.5 IM SUSP
0.5000 mL | Freq: Once | INTRAMUSCULAR | Status: AC
  Administered 2018-06-13: 0.5 mL via INTRAMUSCULAR
  Filled 2018-06-13: qty 0.5

## 2018-06-13 MED ORDER — LIDOCAINE-EPINEPHRINE (PF) 2 %-1:200000 IJ SOLN
10.0000 mL | Freq: Once | INTRAMUSCULAR | Status: AC
  Administered 2018-06-13: 10 mL
  Filled 2018-06-13 (×2): qty 10

## 2018-06-13 NOTE — ED Triage Notes (Signed)
Laceration to his left index finger. He was opening a bottle and glass bottle.

## 2018-06-13 NOTE — ED Provider Notes (Signed)
[facility] EMERGENCY DEPARTMENT Provider Note   CSN: [id number] Arrival date & time: 06/13/18  1314     History   Chief Complaint Chief Complaint  Patient presents with  . Laceration    HPI Roger AlbeeJose C Garfield Jr. is a 32 y.o. male.  The history is provided by the patient.  Laceration   The incident occurred 1 to 2 hours ago (opening a beer bottle and the neck broke cutting his left index finger). The laceration is located on the left hand. The laceration is 2 cm in size. The laceration mechanism was a broken glass. The pain is at a severity of 1/10. The pain is mild. The pain has been constant since onset. He reports no foreign bodies present. His tetanus status is out of date (6 years ago).    Past Medical History:  Diagnosis Date  . Allergy   . Asthma   . GERD (gastroesophageal reflux disease)     Patient Active Problem List   Diagnosis Date Noted  . PCP NOTES >>>>>>>>>>>>>>>>>>>>.. 09/08/2016  . Anxiety 06/02/2013  . Annual physical exam 01/15/2012  . GERD (gastroesophageal reflux disease)   . Allergic rhinitis 01/16/2008  . Asthma 01/16/2008    Past Surgical History:  Procedure Laterality Date  . great toe surgery     right   . HERNIA REPAIR     right        Home Medications    Prior to Admission medications   Medication Sig Start Date End Date Taking? Authorizing Provider  azelastine (ASTELIN) 0.1 % nasal spray Place 2 sprays into both nostrils at bedtime as needed for rhinitis. Use in each nostril as directed 11/22/17  Yes Paz, Nolon RodJose E, MD  budesonide-formoterol [facility](SYMBICORT) 160-4.5 MCG/ACT inhaler Inhale 2 puffs into the lungs 2 (two) times daily. 04/30/18  Yes Paz, Nolon RodJose E, MD  esomeprazole (NEXIUM) 20 MG capsule Take 20 mg by mouth daily as needed.   Yes [provider]  montelukast (SINGULAIR) 10 MG tablet Take 1 tablet (10 mg total) by mouth at bedtime. 04/30/18  Yes Paz, Nolon RodJose E, MD  PROAIR HFA 108 [phone](90 Base) MCG/ACT inhaler Inhale 2  puffs into the lungs every 6 (six) hours as needed for wheezing or shortness of breath. 02/21/18  Yes Wanda PlumpPaz, Vonzell E, MD    Family History Family History  Problem Relation Age of Onset  . Ulcers Paternal Uncle   . Diabetes Other        MGM  . Breast cancer Unknown        aunt  . Colon cancer Neg Hx   . Prostate cancer Neg Hx   . CAD Neg Hx     Social History Social History   Tobacco Use  . Smoking status: Former Smoker    Last attempt to quit: 10/19/2010    Years since quitting: 7.6  . Smokeless tobacco: Never Used  Substance Use Topics  . Alcohol use: Yes    Comment:  rarely  . Drug use: Yes    Types: Marijuana    Comment: not daily     Allergies   Avocado and Banana   Review of Systems Review of Systems  All other systems reviewed and are negative.    Physical Exam Updated Vital Signs BP 119/78   Pulse 77   Temp 97.8 F (36.6 C) (Oral)   Resp 16   Ht 5\' 11"  (1.803 m)   Wt 99.8 kg (220 lb)   SpO2 99%  BMI 30.68 kg/m   Physical Exam  Constitutional: He is oriented to person, place, and time. He appears well-developed and well-nourished. No distress.  HENT:  Head: Normocephalic and atraumatic.  Eyes: Pupils are equal, round, and reactive to light.  Cardiovascular: Normal rate.  Pulmonary/Chest: Effort normal.  Musculoskeletal:       Hands: Neurological: He is alert and oriented to person, place, and time.  Skin: Skin is warm.  Psychiatric: He has a normal mood and affect.  Nursing note and vitals reviewed.    ED Treatments / Results  Labs (all labs ordered are listed, but only abnormal results are displayed) Labs Reviewed - No data to display  EKG None  Radiology No results found.  Procedures Procedures (including critical care time) LACERATION REPAIR Performed by: [facility] Authorized by: Gwyneth Sprout Consent: Verbal consent obtained. Risks and benefits: risks, benefits and alternatives were discussed Consent given by:  patient Patient identity confirmed: provided demographic data Prepped and Draped in normal sterile fashion Wound explored  Laceration Location: left index finger  Laceration Length: 1cm  No Foreign Bodies seen or palpated  Anesthesia: local infiltration  Local anesthetic: lidocaine 2% with epinephrine  Anesthetic total: 1 ml  Irrigation method: syringe Amount of cleaning: standard  Skin closure: 4.0 vicryl  Number of sutures: 5  Technique: simple interrupted  Patient tolerance: Patient tolerated the procedure well with no immediate complications.   Medications Ordered in ED Medications  lidocaine-EPINEPHrine (XYLOCAINE W/EPI) 2 %-1:200000 (PF) injection 10 mL (has no administration in time range)     Initial Impression / Assessment and Plan / ED Course  I have reviewed the triage vital signs and the nursing notes.  Pertinent labs & imaging results that were available during my care of the patient were reviewed by me and considered in my medical decision making (see chart for details).     Presenting with uncomplicated laceration to the left index finger.  No evidence of foreign body.  Tetanus shot updated.  Wound repaired as above.  Final Clinical Impressions(s) / ED Diagnoses   Final diagnoses:  Laceration of left index finger without foreign body without damage to nail, initial encounter    ED Discharge Orders    None       Gwyneth Sprout, MD 06/13/18 1354

## 2018-06-13 NOTE — ED Notes (Signed)
ED Provider at bedside. 

## 2018-06-14 ENCOUNTER — Telehealth: Payer: Self-pay

## 2018-06-14 NOTE — Telephone Encounter (Signed)
Pt is scheduled for June 24, 2018 with Dr Drue NovelPaz.

## 2018-06-14 NOTE — Telephone Encounter (Signed)
Needs OV in 10 days for stitches removal. Please call Pt and schedule visit. Thank you.

## 2018-06-14 NOTE — Telephone Encounter (Signed)
-----   Message from Wanda PlumpJose E Paz, MD sent at 06/14/2018  9:09 AM EDT ----- Regarding: Stitches removal Went to the ER, needs stitches removal ~ 10 days after the ER visit unless he was told to go back to the ER for removal.

## 2018-06-19 ENCOUNTER — Telehealth: Payer: Self-pay

## 2018-06-19 NOTE — Telephone Encounter (Signed)
He got 5 stitches with Vicryl.  Recommend to come back for wound assessment 10 days from the ER visit.   Recommend to keep his appointment for 06/24/2018

## 2018-06-19 NOTE — Telephone Encounter (Signed)
Copied from CRM [phone]. Topic: Quick Communication - See Telephone Encounter >> Jun 19, 2018 11:28 AM Mare LoanBurton, Donna F wrote: Pt had an injury on 06/13/18 and went to the ER and got stitches-the provider at the ER told the patient that the stitches were dissolvable and that he needed to follow up in five days and then he got a call  From Dr. Drue NovelPaz that he needed to follow up in 10 days and he is not sure since they are supposed to be dissolved by now or not and needs to know when to follow up at the right time  Best number [phone]

## 2018-06-19 NOTE — Telephone Encounter (Signed)
Roger PihJackie- can you call Pt- he speaks spanish.

## 2018-06-20 NOTE — Telephone Encounter (Signed)
Called pt and informed the below, pt understood and will come to his appt on Monday 06-24-2018 with Paz.

## 2018-06-24 ENCOUNTER — Ambulatory Visit: Payer: Managed Care, Other (non HMO) | Admitting: Internal Medicine

## 2018-06-24 ENCOUNTER — Encounter: Payer: Self-pay | Admitting: Internal Medicine

## 2018-06-24 VITALS — BP 122/74 | HR 72 | Temp 97.5°F | Resp 16 | Ht 73.0 in | Wt 213.5 lb

## 2018-06-24 DIAGNOSIS — S61218D Laceration without foreign body of other finger without damage to nail, subsequent encounter: Secondary | ICD-10-CM | POA: Diagnosis not present

## 2018-06-24 NOTE — Progress Notes (Signed)
Subjective:    Patient ID: Roger Albee., male    DOB: 02/04/1986, 32 y.o.   MRN: [id number]  DOS:  06/24/2018 Type of visit - description : acute Interval history: Went to the ER 10 days ago with a laceration. Stitches were placed. Reports no problems.   Review of Systems   Past Medical History:  Diagnosis Date  . Allergy   . Asthma   . GERD (gastroesophageal reflux disease)     Past Surgical History:  Procedure Laterality Date  . great toe surgery     right   . HERNIA REPAIR     right    Social History   Socioeconomic History  . Marital status: Married    Spouse name: Not on file  . Number of children: 2  . Years of education: Not on file  . Highest education level: Not on file  Occupational History  . Occupation: Education administrator   Social Needs  . Financial resource strain: Not on file  . Food insecurity:    Worry: Not on file    Inability: Not on file  . Transportation needs:    Medical: Not on file    Non-medical: Not on file  Tobacco Use  . Smoking status: Former Smoker    Last attempt to quit: 10/19/2010    Years since quitting: 7.6  . Smokeless tobacco: Never Used  Substance and Sexual Activity  . Alcohol use: Yes    Comment:  rarely  . Drug use: Yes    Types: Marijuana    Comment: not daily  . Sexual activity: Not on file  Lifestyle  . Physical activity:    Days per week: Not on file    Minutes per session: Not on file  . Stress: Not on file  Relationships  . Social connections:    Talks on phone: Not on file    Gets together: Not on file    Attends religious service: Not on file    Active member of club or organization: Not on file    Attends meetings of clubs or organizations: Not on file    Relationship status: Not on file  . Intimate partner violence:    Fear of current or ex partner: Not on file    Emotionally abused: Not on file    Physically abused: Not on file    Forced sexual activity: Not on file  Other Topics Concern  .  Not on file  Social History Narrative   Originally from [place], [place]         Allergies as of 06/24/2018      Reactions   Avocado Other (See Comments)   Sore throat   Banana Itching      Medication List        Accurate as of 06/24/18  3:26 PM. Always use your most recent med list.          azelastine 0.1 % nasal spray Commonly known as:  ASTELIN Place 2 sprays into both nostrils at bedtime as needed for rhinitis. Use in each nostril as directed   budesonide-formoterol 160-4.5 MCG/ACT inhaler Commonly known as:  SYMBICORT Inhale 2 puffs into the lungs 2 (two) times daily.   esomeprazole 20 MG capsule Commonly known as:  NEXIUM Take 20 mg by mouth daily as needed.   montelukast 10 MG tablet Commonly known as:  SINGULAIR Take 1 tablet (10 mg total) by mouth at bedtime.   PROAIR HFA 108 (90 Base) MCG/ACT  inhaler Generic drug:  albuterol Inhale 2 puffs into the lungs every 6 (six) hours as needed for wheezing or shortness of breath.          Objective:   Physical Exam BP 122/74 (BP Location: Left Arm, Patient Position: Sitting, Cuff Size: Normal)   Pulse 72   Temp (!) 97.5 F (36.4 C) (Oral)   Resp 16   Ht 6\' 1"  (1.854 m)   Wt 213 lb 8 oz (96.8 kg)   SpO2 98%   BMI 28.17 kg/m  General:   Well developed, NAD, see BMI.  HEENT:  Normocephalic . Face symmetric, atraumatic Skin: Laceration, left index seems to be healing well, no redness or discharge. Neurologic:  alert & oriented X3.  Speech normal, gait appropriate for age and unassisted Psych--  Cognition and judgment appear intact.  Cooperative with normal attention span and concentration.  Behavior appropriate. No anxious or depressed appearing.      Assessment & Plan:   Assessment Asthma GERD Allergies  PLAN: laceration, left index, healing well, receive a Tdap already, stitches removed.  Will call if redness, discharge or any signs of infection. Next visit October as a schedule

## 2018-06-24 NOTE — Progress Notes (Signed)
Pre visit review using our clinic review tool, if applicable. No additional management support is needed unless otherwise documented below in the visit note. 

## 2018-06-25 NOTE — Assessment & Plan Note (Signed)
laceration, left index, healing well, receive a Tdap already, stitches removed.  Will call if redness, discharge or any signs of infection. Next visit October as a schedule

## 2018-10-04 ENCOUNTER — Encounter: Payer: Self-pay | Admitting: Internal Medicine

## 2018-10-04 ENCOUNTER — Ambulatory Visit: Payer: Managed Care, Other (non HMO) | Admitting: Internal Medicine

## 2018-10-04 VITALS — BP 128/80 | HR 76 | Temp 98.1°F | Resp 16 | Ht 73.0 in | Wt 212.0 lb

## 2018-10-04 DIAGNOSIS — K219 Gastro-esophageal reflux disease without esophagitis: Secondary | ICD-10-CM

## 2018-10-04 DIAGNOSIS — Z23 Encounter for immunization: Secondary | ICD-10-CM | POA: Diagnosis not present

## 2018-10-04 DIAGNOSIS — J4520 Mild intermittent asthma, uncomplicated: Secondary | ICD-10-CM

## 2018-10-04 MED ORDER — ESOMEPRAZOLE MAGNESIUM 20 MG PO TBEC: 1.0000 | DELAYED_RELEASE_TABLET | Freq: Every day | ORAL | 6 refills | Status: DC

## 2018-10-04 NOTE — Patient Instructions (Signed)
   GO TO THE FRONT DESK Schedule your next appointment for a  Physical exam in 6 months  

## 2018-10-04 NOTE — Progress Notes (Signed)
Subjective:    Patient ID: Roger Morgan., male    DOB: 10-08-1986, 32 y.o.   MRN: [id number]  DOS:  10/04/2018 Type of visit - description : f/u Interval history: Asthma: Well-controlled, has good and consistent use of Symbicort, very rarely use albuterol GERD: Well controlled, taking OTC medications at this point  Review of Systems Denies fever, chills. No recent cough or wheezing.  No sputum production  Past Medical History:  Diagnosis Date  . Allergy   . Asthma   . GERD (gastroesophageal reflux disease)     Past Surgical History:  Procedure Laterality Date  . great toe surgery     right   . HERNIA REPAIR     right    Social History   Socioeconomic History  . Marital status: Married    Spouse name: Not on file  . Number of children: 2  . Years of education: Not on file  . Highest education level: Not on file  Occupational History  . Occupation: Education administrator   Social Needs  . Financial resource strain: Not on file  . Food insecurity:    Worry: Not on file    Inability: Not on file  . Transportation needs:    Medical: Not on file    Non-medical: Not on file  Tobacco Use  . Smoking status: Former Smoker    Last attempt to quit: 10/19/2010    Years since quitting: 7.9  . Smokeless tobacco: Never Used  Substance and Sexual Activity  . Alcohol use: Yes    Comment:  rarely  . Drug use: Yes    Types: Marijuana    Comment: not daily  . Sexual activity: Not on file  Lifestyle  . Physical activity:    Days per week: Not on file    Minutes per session: Not on file  . Stress: Not on file  Relationships  . Social connections:    Talks on phone: Not on file    Gets together: Not on file    Attends religious service: Not on file    Active member of club or organization: Not on file    Attends meetings of clubs or organizations: Not on file    Relationship status: Not on file  . Intimate partner violence:    Fear of current or ex partner: Not on file   Emotionally abused: Not on file    Physically abused: Not on file    Forced sexual activity: Not on file  Other Topics Concern  . Not on file  Social History Narrative   Originally from [place], [place]         Allergies as of 10/04/2018      Reactions   Avocado Other (See Comments)   Sore throat   Banana Itching      Medication List        Accurate as of 10/04/18 11:59 PM. Always use your most recent med list.          azelastine 0.1 % nasal spray Commonly known as:  ASTELIN Place 2 sprays into both nostrils at bedtime as needed for rhinitis. Use in each nostril as directed   budesonide-formoterol 160-4.5 MCG/ACT inhaler Commonly known as:  SYMBICORT Inhale 2 puffs into the lungs 2 (two) times daily.   Esomeprazole Magnesium 20 MG Tbec Take 1 tablet by mouth daily.   montelukast 10 MG tablet Commonly known as:  SINGULAIR Take 1 tablet (10 mg total) by mouth at bedtime.  PROAIR HFA 108 (90 Base) MCG/ACT inhaler Generic drug:  albuterol Inhale 2 puffs into the lungs every 6 (six) hours as needed for wheezing or shortness of breath.          Objective:   Physical Exam BP 128/80 (BP Location: Right Arm, Patient Position: Sitting, Cuff Size: Normal)   Pulse 76   Temp 98.1 F (36.7 C) (Oral)   Resp 16   Ht 6\' 1"  (1.854 m)   Wt 212 lb (96.2 kg)   SpO2 95%   BMI 27.97 kg/m  General:   Well developed, NAD, see BMI.  HEENT:  Normocephalic . Face symmetric, atraumatic Lungs:  CTA B Normal respiratory effort, no intercostal retractions, no accessory muscle use. Heart: RRR,  no murmur.  No pretibial edema bilaterally  Skin: Not pale. Not jaundice Neurologic:  alert & oriented X3.  Speech normal, gait appropriate for age and unassisted Psych--  Cognition and judgment appear intact.  Cooperative with normal attention span and concentration.  Behavior appropriate. No anxious or depressed appearing.      Assessment & Plan:     Assessment Asthma GERD Allergies  PLAN: Asthma: Well-controlled on Symbicort 1 puff twice a day and albuterol as needed.  Symptoms typically get worse in the winter, okay to increase Symbicort 2 puffs B.I.D. when/if needed. Call for refills when needed GERD: Well-controlled on Nexium OTC, prescription printed at patient request. Preventive care: We have a long discussion about flu shot, he is quite hesitant to take it, pros and cons discussed.  Will think about it. RTC 6 months CPX

## 2018-10-06 NOTE — Assessment & Plan Note (Signed)
Asthma: Well-controlled on Symbicort 1 puff twice a day and albuterol as needed.  Symptoms typically get worse in the winter, okay to increase Symbicort 2 puffs B.I.D. when/if needed. Call for refills when needed GERD: Well-controlled on Nexium OTC, prescription printed at patient request. Preventive care: We have a long discussion about flu shot, he is quite hesitant to take it, pros and cons discussed.  Will think about it. RTC 6 months CPX

## 2018-10-28 ENCOUNTER — Encounter: Payer: Self-pay | Admitting: Internal Medicine

## 2018-10-28 ENCOUNTER — Ambulatory Visit: Payer: Managed Care, Other (non HMO) | Admitting: Internal Medicine

## 2018-10-28 VITALS — BP 128/64 | HR 64 | Temp 98.4°F | Resp 16 | Ht 73.0 in | Wt 213.1 lb

## 2018-10-28 DIAGNOSIS — J069 Acute upper respiratory infection, unspecified: Secondary | ICD-10-CM | POA: Diagnosis not present

## 2018-10-28 DIAGNOSIS — J4520 Mild intermittent asthma, uncomplicated: Secondary | ICD-10-CM | POA: Diagnosis not present

## 2018-10-28 DIAGNOSIS — K219 Gastro-esophageal reflux disease without esophagitis: Secondary | ICD-10-CM | POA: Diagnosis not present

## 2018-10-28 DIAGNOSIS — J029 Acute pharyngitis, unspecified: Secondary | ICD-10-CM | POA: Diagnosis not present

## 2018-10-28 LAB — POCT RAPID STREP A (OFFICE): Rapid Strep A Screen: NEGATIVE

## 2018-10-28 MED ORDER — OMEPRAZOLE 20 MG PO CPDR: 20.0000 mg | DELAYED_RELEASE_CAPSULE | Freq: Every day | ORAL | 3 refills | Status: DC

## 2018-10-28 NOTE — Patient Instructions (Signed)
Rest, fluids , tylenol  For cough:  Take Mucinex DM twice a day as needed until better  For nasal congestion: Use OTC Nasocort or Flonase : 2 nasal sprays on each side of the nose in the morning until you feel better Use ASTELIN a prescribed spray : 2 nasal sprays on each side of the nose at night until you feel better    Call if not gradually better over the next  10 days  Call anytime if the symptoms are severe   

## 2018-10-28 NOTE — Progress Notes (Signed)
Subjective:    Patient ID: Roger AlbeeJose C Forbush Jr., male    DOB: February 07, 1986, 32 y.o.   MRN: [id number]  DOS:  10/28/2018 Type of visit - description : acute Symptoms of started 3 days ago with moderate sore throat, worse when he swallows.  Using the OTC spray with some help. He also reports sinus congestion Also needs a refill on PPIs.  Review of Systems No fever chills Asthma is quiet, reports no wheezing or cough.   Past Medical History:  Diagnosis Date  . Allergy   . Asthma   . GERD (gastroesophageal reflux disease)     Past Surgical History:  Procedure Laterality Date  . great toe surgery     right   . HERNIA REPAIR     right    Social History   Socioeconomic History  . Marital status: Married    Spouse name: Not on file  . Number of children: 2  . Years of education: Not on file  . Highest education level: Not on file  Occupational History  . Occupation: Education administratorpainter   Social Needs  . Financial resource strain: Not on file  . Food insecurity:    Worry: Not on file    Inability: Not on file  . Transportation needs:    Medical: Not on file    Non-medical: Not on file  Tobacco Use  . Smoking status: Former Smoker    Last attempt to quit: 10/19/2010    Years since quitting: 8.0  . Smokeless tobacco: Never Used  Substance and Sexual Activity  . Alcohol use: Yes    Comment:  rarely  . Drug use: Yes    Types: Marijuana    Comment: not daily  . Sexual activity: Not on file  Lifestyle  . Physical activity:    Days per week: Not on file    Minutes per session: Not on file  . Stress: Not on file  Relationships  . Social connections:    Talks on phone: Not on file    Gets together: Not on file    Attends religious service: Not on file    Active member of club or organization: Not on file    Attends meetings of clubs or organizations: Not on file    Relationship status: Not on file  . Intimate partner violence:    Fear of current or ex partner: Not on file      Emotionally abused: Not on file    Physically abused: Not on file    Forced sexual activity: Not on file  Other Topics Concern  . Not on file  Social History Narrative   Originally [place]         Allergies as of 10/28/2018      Reactions   Avocado Other (See Comments)   Sore throat   Banana Itching      Medication List        Accurate as of 10/28/18 11:59 PM. Always use your most recent med list.          azelastine 0.1 % nasal spray Commonly known as:  ASTELIN Place 2 sprays into both nostrils at bedtime as needed for rhinitis. Use in each nostril as directed   budesonide-formoterol 160-4.5 MCG/ACT inhaler Commonly known as:  SYMBICORT Inhale 2 puffs into the lungs 2 (two) times daily.   montelukast 10 MG tablet Commonly known as:  SINGULAIR Take 1 tablet (10 mg total) by mouth at bedtime.  omeprazole 20 MG capsule Commonly known as:  PRILOSEC Take 1 capsule (20 mg total) by mouth daily before breakfast.   PROAIR HFA 108 (90 Base) MCG/ACT inhaler Generic drug:  albuterol Inhale 2 puffs into the lungs every 6 (six) hours as needed for wheezing or shortness of breath.           Objective:   Physical Exam BP 128/64 (BP Location: Left Arm, Patient Position: Sitting, Cuff Size: Small)   Pulse 64   Temp 98.4 F (36.9 C) (Oral)   Resp 16   Ht 6\' 1"  (1.854 m)   Wt 213 lb 2 oz (96.7 kg)   SpO2 98%   BMI 28.12 kg/m  General:   Well developed, NAD, BMI noted. HEENT:  Normocephalic . Face symmetric, atraumatic.  TMs normal, throat symmetric, minimal redness, no white discharge.  Nose is slightly congested, sinuses no TTP. Lungs:  CTA B Normal respiratory effort, no intercostal retractions, no accessory muscle use. Heart: RRR,  no murmur.  No pretibial edema bilaterally  Skin: Not pale. Not jaundice Neurologic:  alert & oriented X3.  Speech normal, gait appropriate for age and unassisted Psych--  Cognition and judgment appear intact.   Cooperative with normal attention span and concentration.  Behavior appropriate. No anxious or depressed appearing.      Assessment & Plan:   Assessment Asthma GERD Allergies  PLAN: URI: rapid strep (-), rx conservative treatment  Asthma: Continue Symbicort, currently not exacerbated. GERD: Insurance will not cover Nexium, needs omeprazole.  Will send to [facility] on [facility]

## 2018-10-28 NOTE — Progress Notes (Signed)
Pre visit review using our clinic review tool, if applicable. No additional management support is needed unless otherwise documented below in the visit note. 

## 2018-10-29 NOTE — Assessment & Plan Note (Signed)
URI: rapid strep (-), rx conservative treatment  Asthma: Continue Symbicort, currently not exacerbated. GERD: Insurance will not cover Nexium, needs omeprazole.  [facility]

## 2018-11-14 ENCOUNTER — Other Ambulatory Visit: Payer: Self-pay

## 2018-11-14 MED ORDER — PROAIR HFA 108 (90 BASE) MCG/ACT IN AERS: 2.0000 | INHALATION_SPRAY | Freq: Four times a day (QID) | RESPIRATORY_TRACT | 1 refills | Status: DC | PRN

## 2019-01-14 ENCOUNTER — Other Ambulatory Visit: Payer: Self-pay | Admitting: Internal Medicine

## 2019-02-11 ENCOUNTER — Ambulatory Visit: Payer: Self-pay

## 2019-02-11 NOTE — Telephone Encounter (Signed)
Pt. Reports he started using his inhalers more, 2 weeks ago and was having wheezing at that time. States he usually has more problems with his asthma in the spring. Cough is non-productive.Propping up on pillows at night because of the wheezing.Appointment made for tomorrow per agent. Instructed to call back if symptoms worsen or go to ED.  Answer Assessment - Initial Assessment Questions 1. RESPIRATORY STATUS: "Describe your breathing?" (e.g., wheezing, shortness of breath, unable to speak, severe coughing)      Wheezing 2. ONSET: "When did this breathing problem begin?"      2 WEEKS AGO 3. PATTERN "Does the difficult breathing come and go, or has it been constant since it started?"      bECOMING WORSE 4. SEVERITY: "How bad is your breathing?" (e.g., mild, moderate, severe)    - MILD: No SOB at rest, mild SOB with walking, speaks normally in sentences, can lay down, no retractions, pulse < 100.    - MODERATE: SOB at rest, SOB with minimal exertion and prefers to sit, cannot lie down flat, speaks in phrases, mild retractions, audible wheezing, pulse 100-120.    - SEVERE: Very SOB at rest, speaks in single words, struggling to breathe, sitting hunched forward, retractions, pulse > 120      Mild 5. RECURRENT SYMPTOM: "Have you had difficulty breathing before?" If so, ask: "When was the last time?" and "What happened that time?"      Yes 6. CARDIAC HISTORY: "Do you have any history of heart disease?" (e.g., heart attack, angina, bypass surgery, angioplasty)      No 7. LUNG HISTORY: "Do you have any history of lung disease?"  (e.g., pulmonary embolus, asthma, emphysema)        Asthma  8. CAUSE: "What do you think is causing the breathing problem?"      Asthma 9. OTHER SYMPTOMS: "Do you have any other symptoms? (e.g., dizziness, runny nose, cough, chest pain, fever)     No 10. PREGNANCY: "Is there any chance you are pregnant?" "When was your last menstrual period?"       n/a 11. TRAVEL: "Have  you traveled out of the country in the last month?" (e.g., travel history, exposures)       No  Protocols used: BREATHING DIFFICULTY-A-AH

## 2019-02-12 ENCOUNTER — Ambulatory Visit: Payer: Managed Care, Other (non HMO) | Admitting: Internal Medicine

## 2019-02-12 ENCOUNTER — Encounter: Payer: Self-pay | Admitting: Internal Medicine

## 2019-02-12 VITALS — BP 128/68 | HR 68 | Temp 98.0°F | Resp 16 | Ht 73.0 in | Wt 210.2 lb

## 2019-02-12 DIAGNOSIS — J45901 Unspecified asthma with (acute) exacerbation: Secondary | ICD-10-CM

## 2019-02-12 MED ORDER — BUDESONIDE-FORMOTEROL FUMARATE 160-4.5 MCG/ACT IN AERO: 2.0000 | INHALATION_SPRAY | Freq: Two times a day (BID) | RESPIRATORY_TRACT | 5 refills | Status: DC

## 2019-02-12 MED ORDER — PREDNISONE 10 MG PO TABS: ORAL_TABLET | ORAL | 0 refills | Status: DC

## 2019-02-12 NOTE — Progress Notes (Signed)
Pre visit review using our clinic review tool, if applicable. No additional management support is needed unless otherwise documented below in the visit note. 

## 2019-02-12 NOTE — Patient Instructions (Addendum)
Go back on Symbicort as soon as possible  Albuterol, rescue inhaler: 2 puffs no more often than every 4 hours.  Mucinex DM as needed for cough  Take prednisone as prescribed for few days  I do not think you need antibiotics however if you have fever, chills, you are not improving: Please let me know  ER if severe symptoms, chest pain, significant shortness of breath despite taking medications.

## 2019-02-12 NOTE — Progress Notes (Signed)
Subjective:    Patient ID: Roger Morgan., male    DOB: 21-May-1986, 33 y.o.   MRN: [id number]  DOS:  02/12/2019 Type of visit - description: Acute Asthma was well controlled until 2 weeks ago when he started to have episode of difficulty breathing, cough and whitish sputum production. Symptoms are  often times triggered by exertion. He ran out of Symbicort, he has used albuterol as needed, sometimes more often that every 4 hours.  Review of Systems No fever chills.  No recent travel No recent URI GERD well controlled  Past Medical History:  Diagnosis Date  . Allergy   . Asthma   . GERD (gastroesophageal reflux disease)     Past Surgical History:  Procedure Laterality Date  . great toe surgery     right   . HERNIA REPAIR     right    Social History   Socioeconomic History  . Marital status: Married    Spouse name: Not on file  . Number of children: 2  . Years of education: Not on file  . Highest education level: Not on file  Occupational History  . Occupation: Education administrator   Social Needs  . Financial resource strain: Not on file  . Food insecurity:    Worry: Not on file    Inability: Not on file  . Transportation needs:    Medical: Not on file    Non-medical: Not on file  Tobacco Use  . Smoking status: Former Smoker    Last attempt to quit: 10/19/2010    Years since quitting: 8.3  . Smokeless tobacco: Never Used  Substance and Sexual Activity  . Alcohol use: Yes    Comment:  rarely  . Drug use: Yes    Types: Marijuana    Comment: not daily  . Sexual activity: Not on file  Lifestyle  . Physical activity:    Days per week: Not on file    Minutes per session: Not on file  . Stress: Not on file  Relationships  . Social connections:    Talks on phone: Not on file    Gets together: Not on file    Attends religious service: Not on file    Active member of club or organization: Not on file    Attends meetings of clubs or organizations: Not on file   Relationship status: Not on file  . Intimate partner violence:    Fear of current or ex partner: Not on file    Emotionally abused: Not on file    Physically abused: Not on file    Forced sexual activity: Not on file  Other Topics Concern  . Not on file  Social History Narrative   Originally from [place], [place]         Allergies as of 02/12/2019      Reactions   Avocado Other (See Comments)   Sore throat   Banana Itching      Medication List       Accurate as of February 12, 2019 11:59 PM. Always use your most recent med list.        azelastine 0.1 % nasal spray Commonly known as:  ASTELIN Place 2 sprays into both nostrils at bedtime as needed for rhinitis. Use in each nostril as directed   budesonide-formoterol 160-4.5 MCG/ACT inhaler Commonly known as:  SYMBICORT Inhale 2 puffs into the lungs 2 (two) times daily.   montelukast 10 MG tablet Commonly known as:  SINGULAIR Take  1 tablet (10 mg total) by mouth at bedtime.   omeprazole 20 MG capsule Commonly known as:  PRILOSEC Take 1 capsule (20 mg total) by mouth daily before breakfast.   predniSONE 10 MG tablet Commonly known as:  DELTASONE 5 tablets x 2 days, 4 tablets x 2 days, 3 tabs x 2 days, 2 tabs x 2 days, 1 tab x 2 days   PROAIR HFA 108 (90 Base) MCG/ACT inhaler Generic drug:  albuterol Inhale 2 puffs into the lungs every 6 (six) hours as needed for wheezing or shortness of breath.           Objective:   Physical Exam BP 128/68 (BP Location: Left Arm, Patient Position: Sitting, Cuff Size: Normal)   Pulse 68   Temp 98 F (36.7 C) (Oral)   Resp 16   Ht 6\' 1"  (1.854 m)   Wt 210 lb 4 oz (95.4 kg)   SpO2 95%   BMI 27.74 kg/m  General:   Well developed, NAD, BMI noted. HEENT:  Normocephalic . Face symmetric, atraumatic.  TMs normal. Throat: Not red, no discharge. Lungs:  + Wheezing bilaterally, no increased work of breathing.  No rhonchi.  No accessory muscle use. Heart: RRR,  no murmur.  No  pretibial edema bilaterally  Skin: Not pale. Not jaundice Neurologic:  alert & oriented X3.  Speech normal, gait appropriate for age and unassisted Psych--  Cognition and judgment appear intact.  Cooperative with normal attention span and concentration.  Behavior appropriate. No anxious or depressed appearing.      Assessment     Assessment Asthma GERD Allergies  PLAN: Asthma: Exacerbated for 2 weeks.  No increased work of breathing, nontoxic-appearing.  + Wheezing on exam. Plan: Prednisone taper, go back on Symbicort, continue albuterol but no more than 4 hours.  Mucinex DM. Knows to call me if is not gradually better. ER visit indications discussed. Has a physical in April, come back sooner if asthma still an issue.

## 2019-02-13 NOTE — Assessment & Plan Note (Signed)
Asthma: Exacerbated for 2 weeks.  No increased work of breathing, nontoxic-appearing.  + Wheezing on exam. Plan: Prednisone taper, go back on Symbicort, continue albuterol but no more than 4 hours.  Mucinex DM. Knows to call me if is not gradually better. ER visit indications discussed. Has a physical in April, come back sooner if asthma still an issue.

## 2019-04-08 ENCOUNTER — Ambulatory Visit (INDEPENDENT_AMBULATORY_CARE_PROVIDER_SITE_OTHER): Payer: Managed Care, Other (non HMO) | Admitting: Internal Medicine

## 2019-04-08 ENCOUNTER — Encounter: Payer: Self-pay | Admitting: Internal Medicine

## 2019-04-08 ENCOUNTER — Other Ambulatory Visit: Payer: Self-pay

## 2019-04-08 DIAGNOSIS — Z0000 Encounter for general adult medical examination without abnormal findings: Secondary | ICD-10-CM

## 2019-04-08 NOTE — Progress Notes (Addendum)
Subjective:    Patient ID: Roger Morgan., male    DOB: [birth year], 33 y.o.   MRN: [id number]  DOS:  04/08/2019 Type of visit - description:Virtual Visit via Video Note  I connected with@ on 04/20/19 at  3:00 PM EDT by a video enabled telemedicine application and verified that I am speaking with the correct person using two identifiers.   THIS ENCOUNTER IS A VIRTUAL VISIT DUE TO COVID-19 - PATIENT WAS NOT SEEN IN THE OFFICE. PATIENT HAS CONSENTED TO VIRTUAL VISIT / TELEMEDICINE VISIT   Location of patient: home  Location of provider: office  I discussed the limitations of evaluation and management by telemedicine and the availability of in person appointments. The patient expressed understanding and agreed to proceed.  History of Present Illness: CPX In general feels well, he has no concerns and no symptoms. Chronic medical problems are well controlled.  Review of Systems   A 14 point review of systems is negative    Past Medical History:  Diagnosis Date  . Allergy   . Asthma   . GERD (gastroesophageal reflux disease)     Past Surgical History:  Procedure Laterality Date  . great toe surgery     right   . HERNIA REPAIR     right    Social History   Socioeconomic History  . Marital status: Married    Spouse name: Not on file  . Number of children: 2  . Years of education: Not on file  . Highest education level: Not on file  Occupational History  . Occupation: Education administrator   Social Needs  . Financial resource strain: Not on file  . Food insecurity:    Worry: Not on file    Inability: Not on file  . Transportation needs:    Medical: Not on file    Non-medical: Not on file  Tobacco Use  . Smoking status: Former Smoker    Last attempt to quit: 10/19/2010    Years since quitting: 8.4  . Smokeless tobacco: Never Used  Substance and Sexual Activity  . Alcohol use: Yes    Comment:  rarely  . Drug use: Yes    Types: Marijuana    Comment: not daily  .  Sexual activity: Not on file  Lifestyle  . Physical activity:    Days per week: Not on file    Minutes per session: Not on file  . Stress: Not on file  Relationships  . Social connections:    Talks on phone: Not on file    Gets together: Not on file    Attends religious service: Not on file    Active member of club or organization: Not on file    Attends meetings of clubs or organizations: Not on file    Relationship status: Not on file  . Intimate partner violence:    Fear of current or ex partner: Not on file    Emotionally abused: Not on file    Physically abused: Not on file    Forced sexual activity: Not on file  Other Topics Concern  . Not on file  Social History Narrative   Originally from [place], [place]   Children 2 daughters: 2007, 2013        Family History  Problem Relation Age of Onset  . Ulcers Paternal Uncle   . Diabetes Other        MGM  . Breast cancer Other        aunt  .  Colon cancer Neg Hx   . Prostate cancer Neg Hx   . CAD Neg Hx      Allergies as of 04/08/2019      Reactions   Avocado Other (See Comments)   Sore throat   Banana Itching      Medication List       Accurate as of April 08, 2019 11:59 PM. Always use your most recent med list.        azelastine 0.1 % nasal spray Commonly known as:  ASTELIN Place 2 sprays into both nostrils at bedtime as needed for rhinitis. Use in each nostril as directed   budesonide-formoterol 160-4.5 MCG/ACT inhaler Commonly known as:  Symbicort Inhale 2 puffs into the lungs 2 (two) times daily.   montelukast 10 MG tablet Commonly known as:  SINGULAIR Take 1 tablet (10 mg total) by mouth at bedtime.   omeprazole 20 MG capsule Commonly known as:  PRILOSEC Take 1 capsule (20 mg total) by mouth daily before breakfast.   ProAir HFA 108 (90 Base) MCG/ACT inhaler Generic drug:  albuterol Inhale 2 puffs into the lungs every 6 (six) hours as needed for wheezing or shortness of breath.            Objective:   Physical Exam There were no vitals taken for this visit. This is a virtual visit, I was able to see the patient briefly through video but then we completed the visit by phone due to technical difficulties.  He is alert oriented x3, no apparent distress      Assessmnt      Assessment Asthma GERD Allergies  PLAN: Asthma, currently well controlled, he actually takes Symbicort, Singulair and  pro-air as needed only GERD: Well-controlled on Prilosec as needed although his father has a different PPI that works better for him but his insurance will not cover it. Allergies: On OTC antihistaminics, he thinks is Allegra, well controlled. RTC 1 year.   I discussed the assessment and treatment plan with the patient. The patient was provided an opportunity to ask questions and all were answered. The patient agreed with the plan and demonstrated an understanding of the instructions.   The patient was advised to call back or seek an in-person evaluation if the symptoms worsen or if the condition fails to improve as anticipated.

## 2019-04-08 NOTE — Assessment & Plan Note (Signed)
-  Td 2013. Recommend a flu shot when available  -does practice STE  -Labs: Will call and schedule fasting labs, FLP, CMP, CBC, TSH -Diet and exercise discussed, he is actually doing well, his job is physical, he also exercise regularly.

## 2019-04-09 NOTE — Assessment & Plan Note (Signed)
Asthma, currently well controlled, he actually takes Symbicort, Singulair and  pro-air as needed only GERD: Well-controlled on Prilosec as needed although his father has a different PPI that works better for him but his insurance will not cover it. Allergies: On OTC antihistaminics, he thinks is Allegra, well controlled. RTC 1 year.

## 2019-05-30 ENCOUNTER — Ambulatory Visit: Payer: Self-pay

## 2019-05-30 ENCOUNTER — Other Ambulatory Visit: Payer: Self-pay

## 2019-05-30 ENCOUNTER — Telehealth: Payer: Self-pay | Admitting: *Deleted

## 2019-05-30 ENCOUNTER — Ambulatory Visit (INDEPENDENT_AMBULATORY_CARE_PROVIDER_SITE_OTHER): Payer: Managed Care, Other (non HMO) | Admitting: Internal Medicine

## 2019-05-30 DIAGNOSIS — Z20822 Contact with and (suspected) exposure to covid-19: Secondary | ICD-10-CM

## 2019-05-30 DIAGNOSIS — Z20828 Contact with and (suspected) exposure to other viral communicable diseases: Secondary | ICD-10-CM | POA: Diagnosis not present

## 2019-05-30 NOTE — Telephone Encounter (Signed)
Patient scheduled for covid testing on Monday 06/02/19. Instructions given and order placed

## 2019-05-30 NOTE — Telephone Encounter (Signed)
Virtual visit scheduled.  

## 2019-05-30 NOTE — Telephone Encounter (Signed)
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 He says he received an letter from his coach that one of the players on the team they played on Sunday tested positive for Covid on Tuesday, 05/27/19. 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 He says he has no symptoms, but is concerned for his family and wanted to be sure he's ok. I called the office and spoke to Elnita Maxwell, [facility] who asks to speak to the patient, the call was connected successfully.  Answer Assessment - Initial Assessment Questions 1. CLOSE CONTACT: "Who is the person with the confirmed or suspected COVID-19 infection that you were exposed to?"     Ballplayer 2. PLACE of CONTACT: "Where were you when you were exposed to COVID-19?" (e.g., home, school, medical waiting room; which city?)     [place] 3. TYPE of CONTACT: "How much contact was there?" (e.g., sitting next to, live in same house, work in same office, same building)     Not much contact with that team, no shaking of hands, no benches 4. DURATION of CONTACT: "How long were you in contact with the COVID-19 patient?" (e.g., a few seconds, passed by person, a few minutes, live with the patient)     Passing by the players 5. DATE of CONTACT: "When did you have contact with a COVID-19 patient?" (e.g., how many days ago)     Sunday, 05/25/19 6. TRAVEL: "Have you traveled out of the country recently?" If so, "When and where?"     * Also ask about out-of-state travel, since the CDC has identified some high-risk cities for community spread in the [place].     * Note: Travel becomes less relevant if there is widespread community transmission where the patient lives.     No travel 7. COMMUNITY SPREAD: "Are there lots of cases of COVID-19 (community spread) where you live?" (See public health department website, if unsure)       No 8. SYMPTOMS: "Do you  have any symptoms?" (e.g., fever, cough, breathing difficulty)     No 9. PREGNANCY OR POSTPARTUM: "Is there any chance you are pregnant?" "When was your last menstrual period?" "Did you deliver in the last 2 weeks?"     N/A 10. HIGH RISK: "Do you have any heart or lung problems? Do you have a weak immune system?" (e.g., CHF, COPD, asthma, HIV positive, chemotherapy, renal failure, diabetes mellitus, sickle cell anemia)      Asthma  Protocols used: CORONAVIRUS (COVID-19) EXPOSURE-A-AH

## 2019-05-30 NOTE — Telephone Encounter (Signed)
-----   Message from Colon Branch, MD sent at 05/30/2019  2:06 PM EDT ----- Regarding: Please check for COVID-19, was exposed for few days ago

## 2019-05-30 NOTE — Progress Notes (Signed)
Subjective:    Patient ID: Roger AlbeeJose C Grissom Jr., male    DOB: [birth year], 33 y.o.   MRN: [id number]  DOS:  05/30/2019 Type of visit - description: Virtual Visit via Video Note  I connected with@ on 05/30/19 at  1:40 PM EDT by a video enabled telemedicine application and verified that I am speaking with the correct person using two identifiers.   THIS ENCOUNTER IS A VIRTUAL VISIT DUE TO COVID-19 - PATIENT WAS NOT SEEN IN THE OFFICE. PATIENT HAS CONSENTED TO VIRTUAL VISIT / TELEMEDICINE VISIT   Location of patient: home  Location of provider: office  I discussed the limitations of evaluation and management by telemedicine and the availability of in person appointments. The patient expressed understanding and agreed to proceed.  History of Present Illness: Acute visit 5 days ago, played baseball outdoors, today he learned that 1 of the other players tested positive for COVID-19. He remains asymptomatic. 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 He has a history of asthma, allergies.    Review of Systems Denies fever chills No chest pain no difficulty breathing No nausea, vomiting, diarrhea No cough  Past Medical History:  Diagnosis Date  . Allergy   . Asthma   . GERD (gastroesophageal reflux disease)     Past Surgical History:  Procedure Laterality Date  . great toe surgery     right   . HERNIA REPAIR     right    Social History   Socioeconomic History  . Marital status: Married    Spouse name: Not on file  . Number of children: 2  . Years of education: Not on file  . Highest education level: Not on file  Occupational History  . Occupation: Education administratorpainter   Social Needs  . Financial resource strain: Not on file  . Food insecurity    Worry: Not on file    Inability: Not on file  . Transportation needs    Medical: Not on file    Non-medical: Not on file  Tobacco Use  . Smoking status: Former Smoker    Quit date:  10/19/2010    Years since quitting: 8.6  . Smokeless tobacco: Never Used  Substance and Sexual Activity  . Alcohol use: Yes    Comment:  rarely  . Drug use: Yes    Types: Marijuana    Comment: not daily  . Sexual activity: Not on file  Lifestyle  . Physical activity    Days per week: Not on file    Minutes per session: Not on file  . Stress: Not on file  Relationships  . Social Musicianconnections    Talks on phone: Not on file    Gets together: Not on file    Attends religious service: Not on file    Active member of club or organization: Not on file    Attends meetings of clubs or organizations: Not on file    Relationship status: Not on file  . Intimate partner violence    Fear of current or ex partner: Not on file    Emotionally abused: Not on file    Physically abused: Not on file    Forced sexual activity: Not on file  Other Topics Concern  . Not on file  Social History Narrative   Originally from [place]   Children 2 daughters: 2007, 2013         Allergies as of 05/30/2019  Reactions   Avocado Other (See Comments)   Sore throat   Banana Itching      Medication List       Accurate as of May 30, 2019  2:02 PM. If you have any questions, ask your nurse or doctor.        azelastine 0.1 % nasal spray Commonly known as: ASTELIN Place 2 sprays into both nostrils at bedtime as needed for rhinitis. Use in each nostril as directed   budesonide-formoterol 160-4.5 MCG/ACT inhaler Commonly known as: Symbicort Inhale 2 puffs into the lungs 2 (two) times daily.   montelukast 10 MG tablet Commonly known as: SINGULAIR Take 1 tablet (10 mg total) by mouth at bedtime.   omeprazole 20 MG capsule Commonly known as: PRILOSEC Take 1 capsule (20 mg total) by mouth daily before breakfast.   ProAir HFA 108 (90 Base) MCG/ACT inhaler Generic drug: albuterol Inhale 2 puffs into the lungs every 6 (six) hours as needed for wheezing or shortness of breath.            Objective:   Physical Exam There were no vitals taken for this visit. This is a virtual video visit.  He is alert oriented x3, no apparent distress, specifically no respiratory distress and speaking in complete sentences    Assessment      Assessment Asthma GERD Allergies  PLAN: COVID-19 exposure:  The patient plays baseball, one of the player turned out to be COVID-19 positive.  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 Currently asymptomatic. 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  I discussed the assessment and treatment plan with the patient. The patient was provided an opportunity to ask questions and all were answered. The patient agreed with the plan and demonstrated an understanding of the instructions.   The patient was advised to call back or seek an in-person evaluation if the symptoms worsen or if the condition fails to improve as anticipated.

## 2019-06-01 NOTE — Assessment & Plan Note (Signed)
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 Currently asymptomatic. 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

## 2019-06-02 ENCOUNTER — Other Ambulatory Visit: Payer: Self-pay

## 2019-06-02 DIAGNOSIS — Z20822 Contact with and (suspected) exposure to covid-19: Secondary | ICD-10-CM

## 2019-06-07 LAB — NOVEL CORONAVIRUS, NAA: SARS-CoV-2, NAA: NOT DETECTED

## 2019-06-16 ENCOUNTER — Other Ambulatory Visit: Payer: Self-pay | Admitting: Internal Medicine

## 2019-06-23 ENCOUNTER — Telehealth: Payer: Self-pay | Admitting: Internal Medicine

## 2019-06-23 ENCOUNTER — Ambulatory Visit (INDEPENDENT_AMBULATORY_CARE_PROVIDER_SITE_OTHER): Payer: Managed Care, Other (non HMO) | Admitting: Internal Medicine

## 2019-06-23 ENCOUNTER — Other Ambulatory Visit: Payer: Self-pay

## 2019-06-23 DIAGNOSIS — R509 Fever, unspecified: Secondary | ICD-10-CM | POA: Diagnosis not present

## 2019-06-23 DIAGNOSIS — Z20822 Contact with and (suspected) exposure to covid-19: Secondary | ICD-10-CM

## 2019-06-23 NOTE — Progress Notes (Signed)
Subjective:    Patient ID: Roger AlbeeJose C Weiand Jr., male    DOB: [birth year], 33 y.o.   MRN: [id number]  DOS:  06/23/2019 Type of visit - description: Virtual Visit via Video Note  I connected with@ on 06/23/19 at  3:40 PM EDT by a video enabled telemedicine application and verified that I am speaking with the correct person using two identifiers.   THIS ENCOUNTER IS A VIRTUAL VISIT DUE TO COVID-19 - PATIENT WAS NOT SEEN IN THE OFFICE. PATIENT HAS CONSENTED TO VIRTUAL VISIT / TELEMEDICINE VISIT   Location of patient: home  Location of provider: office  I discussed the limitations of evaluation and management by telemedicine and the availability of in person appointments. The patient expressed understanding and agreed to proceed.  History of Present Illness: Acute Symptoms of started 5 days ago with chills and fever on and off, T-max 100. He having a mild headache, and "eye pain".  No red eye, watery eyes or visual disturbances. No COVID exposures that he knows of. Taking Mucinex as needed    Review of Systems  Denies sore throat.  Appetite is okay. No nausea or vomiting. Had diarrhea once.  No abdominal pain No dysuria or gross hematuria Taste is okay but he has a funny smell on the nose and congestion. Asthma is well controlled.     Past Medical History:  Diagnosis Date  . Allergy   . Asthma   . GERD (gastroesophageal reflux disease)     Past Surgical History:  Procedure Laterality Date  . great toe surgery     right   . HERNIA REPAIR     right    Social History   Socioeconomic History  . Marital status: Married    Spouse name: Not on file  . Number of children: 2  . Years of education: Not on file  . Highest education level: Not on file  Occupational History  . Occupation: Education administratorpainter   Social Needs  . Financial resource strain: Not on file  . Food insecurity    Worry: Not on file    Inability: Not on file  . Transportation needs    Medical: Not on file     Non-medical: Not on file  Tobacco Use  . Smoking status: Former Smoker    Quit date: 10/19/2010    Years since quitting: 8.6  . Smokeless tobacco: Never Used  Substance and Sexual Activity  . Alcohol use: Yes    Comment:  rarely  . Drug use: Yes    Types: Marijuana    Comment: not daily  . Sexual activity: Not on file  Lifestyle  . Physical activity    Days per week: Not on file    Minutes per session: Not on file  . Stress: Not on file  Relationships  . Social Musicianconnections    Talks on phone: Not on file    Gets together: Not on file    Attends religious service: Not on file    Active member of club or organization: Not on file    Attends meetings of clubs or organizations: Not on file    Relationship status: Not on file  . Intimate partner violence    Fear of current or ex partner: Not on file    Emotionally abused: Not on file    Physically abused: Not on file    Forced sexual activity: Not on file  Other Topics Concern  . Not on file  Social History Narrative  Originally from [place], [place] 2 daughters: 2007, 2013         Allergies as of 06/23/2019      Reactions   Avocado Other (See Comments)   Sore throat   Banana Itching      Medication List       Accurate as of June 23, 2019  4:06 PM. If you have any questions, ask your nurse or doctor.        azelastine 0.1 % nasal spray Commonly known as: ASTELIN Place 2 sprays into both nostrils at bedtime as needed for rhinitis. Use in each nostril as directed   budesonide-formoterol 160-4.5 MCG/ACT inhaler Commonly known as: Symbicort Inhale 2 puffs into the lungs 2 (two) times daily.   montelukast 10 MG tablet Commonly known as: SINGULAIR Take 1 tablet (10 mg total) by mouth at bedtime.   omeprazole 20 MG capsule Commonly known as: PRILOSEC Take 1 capsule (20 mg total) by mouth daily before breakfast.   ProAir HFA 108 (90 Base) MCG/ACT inhaler Generic drug: albuterol Inhale 2 puffs into the  lungs every 6 (six) hours as needed for wheezing or shortness of breath.           Objective:   Physical Exam There were no vitals taken for this visit. This is a virtual video visit.  Alert oriented x3, no distress.    Assessment      Assessment Asthma GERD Allergies  PLAN: Febrile illness: The patient has chills, fever up to 100.0, mild headache without abdominal or GU symptoms. Asthma is controlled. Given that we are in the midst of a coronavirus pandemia I have to suspect this is coronavirus and rule that out. Patient is somewhat frustrated because last month he did a test and it was negative but nevertheless COVID-19 cases in [place] are increasing. He eventually agreed to be tested. He knows he will stop being contagious for 2 weeks after the onset of symptoms as long as he become afebrile and asymptomatic. Recommend extreme precautions at home, including distancing from the family as much as he can. Call if not gradually better, ER if severe symptoms.   I discussed the assessment and treatment plan with the patient. The patient was provided an opportunity to ask questions and all were answered. The patient agreed with the plan and demonstrated an understanding of the instructions.   The patient was advised to call back or seek an in-person evaluation if the symptoms worsen or if the condition fails to improve as anticipated.

## 2019-06-23 NOTE — Telephone Encounter (Signed)
Pt is scheduled VOV with [facility] today. Done.   Copied from [place] [phone]. Topic: Appointment Scheduling - Scheduling Inquiry for Clinic >> Jun 23, 2019  7:57 AM Rayann Heman wrote: Reason for CRM: pt called and stated that he having chills, bad smell in nose, very tired.

## 2019-06-24 ENCOUNTER — Other Ambulatory Visit: Payer: Managed Care, Other (non HMO)

## 2019-06-24 ENCOUNTER — Telehealth: Payer: Self-pay | Admitting: *Deleted

## 2019-06-24 DIAGNOSIS — Z20822 Contact with and (suspected) exposure to covid-19: Secondary | ICD-10-CM

## 2019-06-24 NOTE — Telephone Encounter (Signed)
-----   Message from Colon Branch, MD sent at 06/23/2019  4:19 PM EDT ----- Regarding: Please arrange a call with testing, having fever for 5 days.

## 2019-06-24 NOTE — Assessment & Plan Note (Signed)
Febrile illness: The patient has chills, fever up to 100.0, mild headache without abdominal or GU symptoms. Asthma is controlled. Given that we are in the midst of a coronavirus pandemia I have to suspect this is coronavirus and rule that out. Patient is somewhat frustrated because last month he did a test and it was negative but nevertheless COVID-19 cases in [place] are increasing. He eventually agreed to be tested. He knows he will stop being contagious for 2 weeks after the onset of symptoms as long as he become afebrile and asymptomatic. Recommend extreme precautions at home, including distancing from the family as much as he can. Call if not gradually better, ER if severe symptoms.

## 2019-06-24 NOTE — Telephone Encounter (Signed)
Pt scheduled for covid testing today @ [facility] @ 9:15. Order previously placed

## 2019-06-28 LAB — NOVEL CORONAVIRUS, NAA: SARS-CoV-2, NAA: DETECTED — AB

## 2019-07-01 ENCOUNTER — Telehealth: Payer: Self-pay | Admitting: Internal Medicine

## 2019-07-01 NOTE — Telephone Encounter (Signed)
Patient tested + for COVID-19, feeling well, essentially now asymptomatic, last fever was7/18/2020. Plan: Continue masking & precautions, in a couple of days he will be considered noncontagious.  Call if symptoms resurface.

## 2019-07-08 ENCOUNTER — Other Ambulatory Visit: Payer: Self-pay | Admitting: Internal Medicine

## 2019-09-01 ENCOUNTER — Other Ambulatory Visit: Payer: Self-pay | Admitting: Internal Medicine

## 2019-11-21 ENCOUNTER — Other Ambulatory Visit: Payer: Self-pay | Admitting: Internal Medicine

## 2019-12-01 ENCOUNTER — Telehealth: Payer: Self-pay | Admitting: Internal Medicine

## 2019-12-01 MED ORDER — ALBUTEROL SULFATE HFA 108 (90 BASE) MCG/ACT IN AERS: 2.0000 | INHALATION_SPRAY | Freq: Four times a day (QID) | RESPIRATORY_TRACT | 3 refills | Status: DC | PRN

## 2019-12-01 NOTE — Telephone Encounter (Signed)
Pt called stating the pharmacy got in contact with him stating that his insurance would not cover his proair inhaler after 12/12/19. Pt is requesting to have an alternative sent in. Please advise.    [place] [facility]  [address] [place] [address]  Phone: [phone] Fax: [phone]  Not a 24 hour pharmacy; exact hours not known.

## 2019-12-01 NOTE — Telephone Encounter (Signed)
Rx sent for generic albuterol.

## 2019-12-25 ENCOUNTER — Telehealth: Payer: Self-pay | Admitting: Internal Medicine

## 2019-12-25 MED ORDER — ALBUTEROL SULFATE 108 (90 BASE) MCG/ACT IN AEPB: 2.0000 | INHALATION_SPRAY | Freq: Four times a day (QID) | RESPIRATORY_TRACT | 1 refills | Status: DC | PRN

## 2019-12-25 NOTE — Telephone Encounter (Signed)
rx for generic inhaler sent to pharmacy.  Left message on pt vm.  If he has any issues to call us.

## 2019-12-25 NOTE — Telephone Encounter (Signed)
Patient called stating insurance (singa) will not pay for his inhaler as of this year. albuterol (VENTOLIN HFA) 108 (90 Base) MCG/ACT inhaler Patient states he does not have inhaler anymore, and he is very concerned about this issues.   Patient is reaching out to see what PCP can do for him, Patient call back [phone]

## 2019-12-31 ENCOUNTER — Other Ambulatory Visit: Payer: Self-pay

## 2019-12-31 MED ORDER — ALBUTEROL SULFATE HFA 108 (90 BASE) MCG/ACT IN AERS: 2.0000 | INHALATION_SPRAY | Freq: Four times a day (QID) | RESPIRATORY_TRACT | 3 refills | Status: DC | PRN

## 2020-02-17 ENCOUNTER — Other Ambulatory Visit: Payer: Self-pay | Admitting: Internal Medicine

## 2020-02-17 MED ORDER — ALBUTEROL SULFATE HFA 108 (90 BASE) MCG/ACT IN AERS: 2.0000 | INHALATION_SPRAY | Freq: Four times a day (QID) | RESPIRATORY_TRACT | 3 refills | Status: DC | PRN

## 2020-04-11 ENCOUNTER — Telehealth: Payer: Self-pay | Admitting: Internal Medicine

## 2020-04-12 ENCOUNTER — Other Ambulatory Visit: Payer: Self-pay

## 2020-04-12 MED ORDER — OMEPRAZOLE 20 MG PO CPDR: 20.0000 mg | DELAYED_RELEASE_CAPSULE | Freq: Every day | ORAL | 3 refills | Status: DC

## 2020-04-12 NOTE — Telephone Encounter (Signed)
Roger Morgan- Pt is overdue for cpe- can you contact him to schedule please?

## 2020-04-13 ENCOUNTER — Telehealth: Payer: Self-pay | Admitting: Internal Medicine

## 2020-04-13 ENCOUNTER — Telehealth: Payer: Self-pay

## 2020-04-13 MED ORDER — ALBUTEROL SULFATE HFA 108 (90 BASE) MCG/ACT IN AERS: 2.0000 | INHALATION_SPRAY | Freq: Four times a day (QID) | RESPIRATORY_TRACT | 0 refills | Status: DC | PRN

## 2020-04-13 NOTE — Telephone Encounter (Signed)
Rx sent 

## 2020-04-13 NOTE — Telephone Encounter (Signed)
PA cancelled by plan.   This medication or product is on your plan's list of covered drugs. Prior authorization is not required at this time. If your pharmacy has questions regarding the processing of your prescription, please have them call the [facility] pharmacy help desk at [phone]. **Please note: Formulary lowering, tiering exception, cost reduction and prospective Medicare hospice reviews cannot be requested using this method of submission. Please contact us at [phone] instead. 

## 2020-04-13 NOTE — Telephone Encounter (Signed)
PA initiated via Covermymeds; KEY: [id number]. Awaiting determination.

## 2020-04-13 NOTE — Telephone Encounter (Signed)
Caller : Joy  Call Back # [phone]  Patient state he is out of medication and can not wait to receive inhale in the mail (7-10days business days). Please send medication [facility]   albuterol (PROAIR HFA) 108 (90 Base) MCG/ACT inhaler [[id number]]  [id number] - [place], [place] [address] AT [facility] [address]  [address], [place] [address]  Phone:  [phone] Fax:  [phone]

## 2020-04-19 ENCOUNTER — Encounter: Payer: Managed Care, Other (non HMO) | Admitting: Internal Medicine

## 2020-04-20 ENCOUNTER — Other Ambulatory Visit: Payer: Self-pay

## 2020-04-20 ENCOUNTER — Ambulatory Visit (INDEPENDENT_AMBULATORY_CARE_PROVIDER_SITE_OTHER): Payer: Managed Care, Other (non HMO) | Admitting: Internal Medicine

## 2020-04-20 ENCOUNTER — Encounter: Payer: Self-pay | Admitting: Internal Medicine

## 2020-04-20 VITALS — BP 126/65 | HR 72 | Temp 97.2°F | Resp 18 | Ht 73.0 in | Wt 214.0 lb

## 2020-04-20 DIAGNOSIS — E785 Hyperlipidemia, unspecified: Secondary | ICD-10-CM | POA: Diagnosis not present

## 2020-04-20 DIAGNOSIS — R7989 Other specified abnormal findings of blood chemistry: Secondary | ICD-10-CM | POA: Diagnosis not present

## 2020-04-20 DIAGNOSIS — J4520 Mild intermittent asthma, uncomplicated: Secondary | ICD-10-CM

## 2020-04-20 DIAGNOSIS — Z0000 Encounter for general adult medical examination without abnormal findings: Secondary | ICD-10-CM

## 2020-04-20 LAB — LIPID PANEL
Cholesterol: 220 mg/dL — ABNORMAL HIGH (ref 0–200)
HDL: 42.2 mg/dL (ref >39.00)
Total CHOL/HDL Ratio: 5
Triglycerides: 565 mg/dL — ABNORMAL HIGH (ref 0.0–149.0)

## 2020-04-20 LAB — CBC WITH DIFFERENTIAL/PLATELET
Basophils Absolute: 0.1 10*3/uL (ref 0.0–0.1)
Basophils Relative: 1 % (ref 0.0–3.0)
Eosinophils Absolute: 0.2 10*3/uL (ref 0.0–0.7)
Eosinophils Relative: 3.8 % (ref 0.0–5.0)
HCT: 41.9 % (ref 39.0–52.0)
Hemoglobin: 15 g/dL (ref 13.0–17.0)
Lymphocytes Relative: 33.7 % (ref 12.0–46.0)
Lymphs Abs: 2.1 10*3/uL (ref 0.7–4.0)
MCHC: 35.9 g/dL (ref 30.0–36.0)
MCV: 93.7 fl (ref 78.0–100.0)
Monocytes Absolute: 0.4 10*3/uL (ref 0.1–1.0)
Monocytes Relative: 6.8 % (ref 3.0–12.0)
Neutro Abs: 3.4 10*3/uL (ref 1.4–7.7)
Neutrophils Relative %: 54.7 % (ref 43.0–77.0)
Platelets: 289 10*3/uL (ref 150.0–400.0)
RBC: 4.47 Mil/uL (ref 4.22–5.81)
RDW: 12.1 % (ref 11.5–15.5)
WBC: 6.2 10*3/uL (ref 4.0–10.5)

## 2020-04-20 LAB — COMPREHENSIVE METABOLIC PANEL
ALT: 32 U/L (ref 0–53)
AST: 16 U/L (ref 0–37)
Albumin: 4.7 g/dL (ref 3.5–5.2)
Alkaline Phosphatase: 67 U/L (ref 39–117)
BUN: 11 mg/dL (ref 6–23)
CO2: 28 mEq/L (ref 19–32)
Calcium: 9.5 mg/dL (ref 8.4–10.5)
Chloride: 101 mEq/L (ref 96–112)
Creatinine, Ser: 1.01 mg/dL (ref 0.40–1.50)
GFR: 85.01 mL/min (ref >60.00)
Glucose, Bld: 97 mg/dL (ref 70–99)
Potassium: 3.9 mEq/L (ref 3.5–5.1)
Sodium: 136 mEq/L (ref 135–145)
Total Bilirubin: 0.5 mg/dL (ref 0.2–1.2)
Total Protein: 7.1 g/dL (ref 6.0–8.3)

## 2020-04-20 LAB — TSH: TSH: 7.69 u[IU]/mL — ABNORMAL HIGH (ref 0.35–4.50)

## 2020-04-20 LAB — LDL CHOLESTEROL, DIRECT: Direct LDL: 118 mg/dL

## 2020-04-20 NOTE — Patient Instructions (Addendum)
COVID-19 Vaccine Information can be found at: [email or web address] For questions related to vaccine distribution or appointments, please email [email or web address] or call [phone].      GO TO THE LAB : Get the blood work     GO TO THE FRONT DESK, PLEASE SCHEDULE YOUR APPOINTMENTS Come back for a physical exam in 1 year 

## 2020-04-20 NOTE — Progress Notes (Signed)
Pre visit review using our clinic review tool, if applicable. No additional management support is needed unless otherwise documented below in the visit note. 

## 2020-04-20 NOTE — Progress Notes (Signed)
Subjective:    Patient ID: Roger Morgan., male    DOB: January 18, 1986, 34 y.o.   MRN: [id number]  DOS:  04/20/2020 Type of visit - description: CPX In general feeling well, no major concerns.  Wt Readings from Last 3 Encounters:  04/20/20 214 lb (97.1 kg)  02/12/19 210 lb 4 oz (95.4 kg)  10/28/18 213 lb 2 oz (96.7 kg)     Review of Systems  A 14 point review of systems is negative    Past Medical History:  Diagnosis Date  . Allergy   . Asthma   . GERD (gastroesophageal reflux disease)     Past Surgical History:  Procedure Laterality Date  . great toe surgery     right   . HERNIA REPAIR     right   Family History  Problem Relation Age of Onset  . Ulcers Paternal Uncle   . Diabetes Other        MGM  . Breast cancer Other        aunt  . Colon cancer Neg Hx   . Prostate cancer Neg Hx   . CAD Neg Hx      Allergies as of 04/20/2020      Reactions   Avocado Other (See Comments)   Sore throat   Banana Itching      Medication List       Accurate as of Apr 20, 2020 11:59 PM. If you have any questions, ask your nurse or doctor.        albuterol 108 (90 Base) MCG/ACT inhaler Commonly known as: ProAir HFA Inhale 2 puffs into the lungs every 6 (six) hours as needed for wheezing or shortness of breath.   azelastine 0.1 % nasal spray Commonly known as: ASTELIN Place 2 sprays into both nostrils at bedtime as needed for rhinitis. Use in each nostril as directed   budesonide-formoterol 160-4.5 MCG/ACT inhaler Commonly known as: Symbicort Inhale 2 puffs into the lungs in the morning and at bedtime.   montelukast 10 MG tablet Commonly known as: SINGULAIR Take 1 tablet (10 mg total) by mouth at bedtime.   omeprazole 20 MG capsule Commonly known as: PRILOSEC Take 1 capsule (20 mg total) by mouth daily before breakfast.          Objective:   Physical Exam BP 126/65 (BP Location: Left Arm, Patient Position: Sitting, Cuff Size: Normal)   Pulse 72    Temp (!) 97.2 F (36.2 C) (Temporal)   Resp 18   Ht 6\' 1"  (1.854 m)   Wt 214 lb (97.1 kg)   SpO2 100%   BMI 28.23 kg/m  General: Well developed, NAD, BMI noted Neck: No  thyromegaly  HEENT:  Normocephalic . Face symmetric, atraumatic Lungs:  CTA B Normal respiratory effort, no intercostal retractions, no accessory muscle use. Heart: RRR,  no murmur.  Abdomen:  Not distended, soft, non-tender. No rebound or rigidity.   Lower extremities: no pretibial edema bilaterally  Skin: Exposed areas without rash. Not pale. Not jaundice Neurologic:  alert & oriented X3.  Speech normal, gait appropriate for age and unassisted Strength symmetric and appropriate for age.  Psych: Cognition and judgment appear intact.  Cooperative with normal attention span and concentration.  Behavior appropriate. No anxious or depressed appearing.     Assessment      Assessment Asthma GERD Allergies COVID-19 06-2019   PLAN: Here for CPX COVID-19: Diagnosed last year, fully recuperated. Asthma:controlled, typically takes Symbicort once a day  and rarely uses albuterol. RTC 1 year  This visit occurred during the SARS-CoV-2 public health emergency.  Safety protocols were in place, including screening questions prior to the visit, additional usage of staff PPE, and extensive cleaning of exam room while observing appropriate contact time as indicated for disinfecting solutions.

## 2020-04-21 ENCOUNTER — Other Ambulatory Visit (INDEPENDENT_AMBULATORY_CARE_PROVIDER_SITE_OTHER): Payer: Managed Care, Other (non HMO)

## 2020-04-21 DIAGNOSIS — R7989 Other specified abnormal findings of blood chemistry: Secondary | ICD-10-CM

## 2020-04-21 LAB — T3, FREE: T3, Free: 3.8 pg/mL (ref 2.3–4.2)

## 2020-04-21 LAB — T4, FREE: Free T4: 0.98 ng/dL (ref 0.60–1.60)

## 2020-04-21 NOTE — Assessment & Plan Note (Signed)
-  Td 2019 - covid vaccine strongly recommended  -does practice STE  -Labs: Nonfasting, check CMP, LP, CBC, TSH -Diet and exercise discussed, over the last year he will let his guard down but is going back to more physical activity, encouraged a healthy diet.

## 2020-04-21 NOTE — Assessment & Plan Note (Signed)
Here for CPX COVID-19: Diagnosed last year, fully recuperated. Asthma:controlled, typically takes Symbicort once a day and rarely uses albuterol. RTC 1 year

## 2020-04-22 NOTE — Addendum Note (Signed)
Addended byConrad [place] D on: 04/22/2020 03:08 PM   Modules accepted: Orders

## 2020-04-26 ENCOUNTER — Telehealth: Payer: Self-pay | Admitting: Internal Medicine

## 2020-04-26 NOTE — Telephone Encounter (Signed)
Called pt and schedule lab appt for 08-10. Done

## 2020-04-26 NOTE — Telephone Encounter (Signed)
-----   Message from [facility] sent at 04/23/2020  2:55 PM EDT ----- Regarding: FW: Lab appt  ----- Message ----- From: Conrad , CMA Sent: 04/23/2020  11:37 AM EDT To: Roger Morgan Subject: Lab appt                                       Needs fasting labs in 3 months please.

## 2020-05-02 ENCOUNTER — Other Ambulatory Visit: Payer: Self-pay | Admitting: Internal Medicine

## 2020-06-12 ENCOUNTER — Other Ambulatory Visit: Payer: Self-pay | Admitting: Internal Medicine

## 2020-07-20 ENCOUNTER — Other Ambulatory Visit: Payer: Managed Care, Other (non HMO)

## 2020-07-20 ENCOUNTER — Other Ambulatory Visit: Payer: Self-pay

## 2020-07-20 ENCOUNTER — Other Ambulatory Visit (INDEPENDENT_AMBULATORY_CARE_PROVIDER_SITE_OTHER): Payer: Managed Care, Other (non HMO)

## 2020-07-20 ENCOUNTER — Telehealth: Payer: Self-pay | Admitting: Internal Medicine

## 2020-07-20 DIAGNOSIS — R7989 Other specified abnormal findings of blood chemistry: Secondary | ICD-10-CM

## 2020-07-20 DIAGNOSIS — E785 Hyperlipidemia, unspecified: Secondary | ICD-10-CM

## 2020-07-20 LAB — T4, FREE: Free T4: 1.08 ng/dL (ref 0.60–1.60)

## 2020-07-20 LAB — LIPID PANEL
Cholesterol: 205 mg/dL — ABNORMAL HIGH (ref 0–200)
HDL: 46.4 mg/dL (ref >39.00)
NonHDL: 158.16
Total CHOL/HDL Ratio: 4
Triglycerides: 236 mg/dL — ABNORMAL HIGH (ref 0.0–149.0)
VLDL: 47.2 mg/dL — ABNORMAL HIGH (ref 0.0–40.0)

## 2020-07-20 LAB — TSH: TSH: 5.45 u[IU]/mL — ABNORMAL HIGH (ref 0.35–4.50)

## 2020-07-20 LAB — LDL CHOLESTEROL, DIRECT: Direct LDL: 109 mg/dL

## 2020-07-20 NOTE — Telephone Encounter (Signed)
Pt dropped off document to be filled out by provider (Physical [employer] document - 2 pages) Pt would like document to be faxed to [phone] and to please call pt when document is faxed to inform pt document was sent, (pt tel [phone]). Document put at front office tray under provider name.

## 2020-07-20 NOTE — Telephone Encounter (Signed)
Form placed in red folder for completion. 

## 2020-07-21 LAB — THYROID PEROXIDASE ANTIBODIES (TPO) (REFL): Thyroperoxidase Ab SerPl-aCnc: 1 IU/mL (ref <9)

## 2020-07-21 NOTE — Telephone Encounter (Signed)
Form faxed and confirmation received.  Notified patient that form was faxed.

## 2020-07-21 NOTE — Telephone Encounter (Signed)
We will complete the form

## 2020-07-21 NOTE — Telephone Encounter (Signed)
Left message on machine for patient to call back with waist measurement.

## 2020-07-21 NOTE — Telephone Encounter (Signed)
Patient states his waist measurement is 34

## 2020-08-03 ENCOUNTER — Ambulatory Visit: Payer: Managed Care, Other (non HMO) | Attending: Internal Medicine

## 2020-08-03 DIAGNOSIS — Z23 Encounter for immunization: Secondary | ICD-10-CM

## 2020-08-03 NOTE — Progress Notes (Signed)
   [facility]  Name:  Geovany Trudo.    MRN: [id number] DOB: December 11, 1986  08/03/2020  Mr. Heinlen was observed post Covid-19 immunization for 15 minutes without incident. He was provided with Vaccine Information Sheet and instruction to access the V-Safe system.   Mr. Titsworth was instructed to call 911 with any severe reactions post vaccine: Marland Kitchen Difficulty breathing  . Swelling of face and throat  . A fast heartbeat  . A bad rash all over body  . Dizziness and weakness   Immunizations Administered    Name Date Dose VIS Date Route   Pfizer COVID-19 Vaccine 08/03/2020 11:24 AM 0.3 mL 02/04/2019 Intramuscular   Manufacturer: [employer], [employer]   Lot: [id number]   NDC: [address]-3

## 2020-08-24 ENCOUNTER — Ambulatory Visit: Payer: Managed Care, Other (non HMO) | Attending: Internal Medicine

## 2020-08-24 DIAGNOSIS — Z23 Encounter for immunization: Secondary | ICD-10-CM

## 2020-08-24 NOTE — Progress Notes (Signed)
   [facility]  Name:  Roger Morgan.    MRN: [id number] DOB: 1986-01-27  08/24/2020  Roger Morgan was observed post Covid-19 immunization for 15 minutes without incident. He was provided with Vaccine Information Sheet and instruction to access the V-Safe system.   Roger Morgan was instructed to call 911 with any severe reactions post vaccine: Roger Morgan Kitchen Difficulty breathing  . Swelling of face and throat  . A fast heartbeat  . A bad rash all over body  . Dizziness and weakness   Immunizations Administered    Name Date Dose VIS Date Route   Pfizer COVID-19 Vaccine 08/24/2020 11:35 AM 0.3 mL 02/04/2019 Intramuscular   Manufacturer: [employer], [employer]   Lot: [id number]   NDC: [id number]

## 2020-10-18 ENCOUNTER — Other Ambulatory Visit: Payer: Self-pay | Admitting: Internal Medicine

## 2021-02-08 ENCOUNTER — Other Ambulatory Visit: Payer: Self-pay

## 2021-02-08 DIAGNOSIS — R7989 Other specified abnormal findings of blood chemistry: Secondary | ICD-10-CM

## 2021-02-23 ENCOUNTER — Telehealth: Payer: Self-pay | Admitting: Internal Medicine

## 2021-02-23 ENCOUNTER — Other Ambulatory Visit: Payer: Self-pay | Admitting: Internal Medicine

## 2021-02-23 MED ORDER — ALBUTEROL SULFATE HFA 108 (90 BASE) MCG/ACT IN AERS: 2.0000 | INHALATION_SPRAY | Freq: Four times a day (QID) | RESPIRATORY_TRACT | 5 refills | Status: DC | PRN

## 2021-02-23 NOTE — Telephone Encounter (Signed)
Albuterol Rx sent to [facility].

## 2021-02-23 NOTE — Telephone Encounter (Signed)
Rx sent 

## 2021-02-23 NOTE — Telephone Encounter (Signed)
Pt states that he CPE was cancelled and rescheduled til 5/25. Pt states he can not wait that long for his medication and would like for Dr. Drue Novel to send in a rx for him. States he contacted his pharmacy and they were going to send the request.

## 2021-02-23 NOTE — Telephone Encounter (Signed)
Medication:albuterol (PROAIR HFA) 108 (90 Base) MCG/ACT inhaler [[id number]      Has the patient contacted their pharmacy? no (If no, request that the patient contact the pharmacy for the refill.) (If yes, when and what did the pharmacy advise?)    Preferred Pharmacy (with phone number or street name):   [id number] - [place], [place] - [address] AT [facility] [address] Phone:  [phone]  Fax:  [phone]       Agent: Please be advised that RX refills may take up to 3 business days. We ask that you follow-up with your pharmacy.

## 2021-04-12 ENCOUNTER — Other Ambulatory Visit: Payer: Self-pay | Admitting: Internal Medicine

## 2021-04-19 ENCOUNTER — Other Ambulatory Visit: Payer: Self-pay | Admitting: Internal Medicine

## 2021-04-25 ENCOUNTER — Encounter: Payer: Managed Care, Other (non HMO) | Admitting: Internal Medicine

## 2021-05-04 ENCOUNTER — Other Ambulatory Visit: Payer: Self-pay

## 2021-05-04 ENCOUNTER — Ambulatory Visit (INDEPENDENT_AMBULATORY_CARE_PROVIDER_SITE_OTHER): Payer: Managed Care, Other (non HMO) | Admitting: Internal Medicine

## 2021-05-04 ENCOUNTER — Encounter: Payer: Self-pay | Admitting: Internal Medicine

## 2021-05-04 VITALS — BP 126/68 | HR 89 | Temp 98.3°F | Resp 16 | Ht 73.0 in | Wt 215.0 lb

## 2021-05-04 DIAGNOSIS — E785 Hyperlipidemia, unspecified: Secondary | ICD-10-CM | POA: Diagnosis not present

## 2021-05-04 DIAGNOSIS — E038 Other specified hypothyroidism: Secondary | ICD-10-CM | POA: Diagnosis not present

## 2021-05-04 DIAGNOSIS — Z1159 Encounter for screening for other viral diseases: Secondary | ICD-10-CM

## 2021-05-04 DIAGNOSIS — Z0000 Encounter for general adult medical examination without abnormal findings: Secondary | ICD-10-CM | POA: Diagnosis not present

## 2021-05-04 MED ORDER — ALBUTEROL SULFATE HFA 108 (90 BASE) MCG/ACT IN AERS: 2.0000 | INHALATION_SPRAY | Freq: Four times a day (QID) | RESPIRATORY_TRACT | 5 refills | Status: DC | PRN

## 2021-05-04 MED ORDER — MONTELUKAST SODIUM 10 MG PO TABS: 10.0000 mg | ORAL_TABLET | Freq: Every day | ORAL | 3 refills | Status: DC

## 2021-05-04 NOTE — Patient Instructions (Signed)
   GO TO THE LAB : Get the blood work     GO TO THE FRONT DESK, PLEASE SCHEDULE YOUR APPOINTMENTS Come back for a physical exam in 1 year 

## 2021-05-04 NOTE — Progress Notes (Signed)
Subjective:    Patient ID: Roger Morgan., male    DOB: 01-01-1986, 35 y.o.   MRN: [id number]  DOS:  05/04/2021 Type of visit - description: CPX  Here for CPX, since the last office visit he is doing well.  Review of Systems  Other than above, a 14 point review of systems is negative      Past Medical History:  Diagnosis Date  . Allergy   . Asthma   . GERD (gastroesophageal reflux disease)     Past Surgical History:  Procedure Laterality Date  . great toe surgery     right   . HERNIA REPAIR     right   Social History   Socioeconomic History  . Marital status: Married    Spouse name: Not on file  . Number of children: 2  . Years of education: Not on file  . Highest education level: Not on file  Occupational History  . Occupation: Education administrator   Tobacco Use  . Smoking status: Former Smoker    Quit date: 10/19/2010    Years since quitting: 10.5  . Smokeless tobacco: Never Used  Substance and Sexual Activity  . Alcohol use: Yes    Comment:  rarely  . Drug use: Yes    Types: Marijuana    Comment: not daily  . Sexual activity: Not on file  Other Topics Concern  . Not on file  Social History Narrative   Originally from [place], [place]   Children    2 daughters: 2007, 2013   Son 2021         Social Determinants of Health   Financial Resource Strain: Not on [employer] Insecurity: Not on file  Transportation Needs: Not on file  Physical Activity: Not on file  Stress: Not on file  Social Connections: Not on file  Intimate Partner Violence: Not on file     Allergies as of 05/04/2021      Reactions   Avocado Other (See Comments)   Sore throat   Banana Itching      Medication List       Accurate as of May 04, 2021 11:59 PM. If you have any questions, ask your nurse or doctor.        albuterol 108 (90 Base) MCG/ACT inhaler Commonly known as: ProAir HFA Inhale 2 puffs into the lungs every 6 (six) hours as needed for wheezing or shortness of  breath.   azelastine 0.1 % nasal spray Commonly known as: ASTELIN Place 2 sprays into both nostrils at bedtime as needed for rhinitis. Use in each nostril as directed   budesonide-formoterol 160-4.5 MCG/ACT inhaler Commonly known as: Symbicort Inhale 2 puffs into the lungs in the morning and at bedtime.   montelukast 10 MG tablet Commonly known as: SINGULAIR Take 1 tablet (10 mg total) by mouth at bedtime.   omeprazole 20 MG capsule Commonly known as: PRILOSEC Take 1 capsule (20 mg total) by mouth daily before breakfast.          Objective:   Physical Exam BP 126/68 (BP Location: Left Arm, Patient Position: Sitting, Cuff Size: Normal)   Pulse 89   Temp 98.3 F (36.8 C) (Oral)   Resp 16   Ht 6\' 1"  (1.854 m)   Wt 215 lb (97.5 kg)   SpO2 98%   BMI 28.37 kg/m  General: Well developed, NAD, BMI noted Neck: No  thyromegaly  HEENT:  Normocephalic . Face symmetric, atraumatic Lungs:  CTA  B Normal respiratory effort, no intercostal retractions, no accessory muscle use. Heart: RRR,  no murmur.  Abdomen:  Not distended, soft, non-tender. No rebound or rigidity.   Lower extremities: no pretibial edema bilaterally  Skin: Exposed areas without rash. Not pale. Not jaundice Neurologic:  alert & oriented X3.  Speech normal, gait appropriate for age and unassisted Strength symmetric and appropriate for age.  Psych: Cognition and judgment appear intact.  Cooperative with normal attention span and concentration.  Behavior appropriate. No anxious or depressed appearing.     Assessment     Assessment Asthma GERD Allergies COVID-19:  06-2019   PLAN: Here for CPX Asthma: Good compliance with Symbicort, Singulair.  Uses albuterol rarely. GERD: On PPIs, doing well. Subclinical hypothyroidism: Asymptomatic, check TSH and free T4. RTC 1 year    This visit occurred during the SARS-CoV-2 public health emergency.  Safety protocols were in place, including screening  questions prior to the visit, additional usage of staff PPE, and extensive cleaning of exam room while observing appropriate contact time as indicated for disinfecting solutions.

## 2021-05-05 LAB — COMPREHENSIVE METABOLIC PANEL
ALT: 36 U/L (ref 0–53)
AST: 21 U/L (ref 0–37)
Albumin: 4.4 g/dL (ref 3.5–5.2)
Alkaline Phosphatase: 54 U/L (ref 39–117)
BUN: 12 mg/dL (ref 6–23)
CO2: 27 mEq/L (ref 19–32)
Calcium: 9.4 mg/dL (ref 8.4–10.5)
Chloride: 102 mEq/L (ref 96–112)
Creatinine, Ser: 1.01 mg/dL (ref 0.40–1.50)
GFR: 97.26 mL/min (ref >60.00)
Glucose, Bld: 132 mg/dL — ABNORMAL HIGH (ref 70–99)
Potassium: 3.7 mEq/L (ref 3.5–5.1)
Sodium: 139 mEq/L (ref 135–145)
Total Bilirubin: 0.6 mg/dL (ref 0.2–1.2)
Total Protein: 6.6 g/dL (ref 6.0–8.3)

## 2021-05-05 LAB — LIPID PANEL
Cholesterol: 230 mg/dL — ABNORMAL HIGH (ref 0–200)
HDL: 52 mg/dL (ref >39.00)
NonHDL: 178.4
Total CHOL/HDL Ratio: 4
Triglycerides: 275 mg/dL — ABNORMAL HIGH (ref 0.0–149.0)
VLDL: 55 mg/dL — ABNORMAL HIGH (ref 0.0–40.0)

## 2021-05-05 LAB — LDL CHOLESTEROL, DIRECT: Direct LDL: 152 mg/dL

## 2021-05-05 LAB — HEPATITIS C ANTIBODY
Hepatitis C Ab: NONREACTIVE
SIGNAL TO CUT-OFF: 0 (ref <1.00)

## 2021-05-05 LAB — T4, FREE: Free T4: 0.68 ng/dL (ref 0.60–1.60)

## 2021-05-05 LAB — TSH: TSH: 3.76 u[IU]/mL (ref 0.35–4.50)

## 2021-05-05 NOTE — Assessment & Plan Note (Signed)
-  Td 2019 -COVID-vaccine x2, recommend a booster due to history of asthma. -does practice STE  -Labs: Nonfasting, CMP, FLP, TSH, free T4, hep C -Diet and exercise discussed

## 2021-05-05 NOTE — Assessment & Plan Note (Signed)
Here for CPX Asthma: Good compliance with Symbicort, Singulair.  Uses albuterol rarely. GERD: On PPIs, doing well. Subclinical hypothyroidism: Asymptomatic, check TSH and free T4. RTC 1 year

## 2021-07-27 ENCOUNTER — Telehealth: Payer: Self-pay | Admitting: Internal Medicine

## 2021-07-27 NOTE — Telephone Encounter (Signed)
Form completed, awaiting PCP signature. Spoke w/ Pt- informed that I will fax form and have ready for pick up at front desk for him tomorrow. Pt verbalized understanding. Copy sent for scanning.

## 2021-07-27 NOTE — Telephone Encounter (Signed)
Pt's spouse dropped off document to be filled out by provider (1 page Health Screen form) Pt would like to have document faxed when ready to [phone] and to have a copy at the office for pt to pick up. Document put at front office tray under providers name.

## 2021-08-07 ENCOUNTER — Other Ambulatory Visit: Payer: Self-pay | Admitting: Internal Medicine

## 2021-12-21 ENCOUNTER — Other Ambulatory Visit: Payer: Self-pay | Admitting: Internal Medicine

## 2022-01-02 ENCOUNTER — Other Ambulatory Visit: Payer: Self-pay | Admitting: Internal Medicine

## 2022-03-20 ENCOUNTER — Other Ambulatory Visit: Payer: Self-pay | Admitting: Internal Medicine

## 2022-05-05 ENCOUNTER — Ambulatory Visit (INDEPENDENT_AMBULATORY_CARE_PROVIDER_SITE_OTHER): Payer: Managed Care, Other (non HMO) | Admitting: Internal Medicine

## 2022-05-05 ENCOUNTER — Encounter: Payer: Self-pay | Admitting: Internal Medicine

## 2022-05-05 VITALS — BP 126/70 | HR 76 | Temp 98.1°F | Resp 16 | Ht 73.0 in | Wt 215.5 lb

## 2022-05-05 DIAGNOSIS — E785 Hyperlipidemia, unspecified: Secondary | ICD-10-CM | POA: Diagnosis not present

## 2022-05-05 DIAGNOSIS — E038 Other specified hypothyroidism: Secondary | ICD-10-CM

## 2022-05-05 DIAGNOSIS — K648 Other hemorrhoids: Secondary | ICD-10-CM | POA: Diagnosis not present

## 2022-05-05 DIAGNOSIS — Z0000 Encounter for general adult medical examination without abnormal findings: Secondary | ICD-10-CM

## 2022-05-05 MED ORDER — HYDROCORTISONE ACETATE 25 MG RE SUPP: 25.0000 mg | Freq: Two times a day (BID) | RECTAL | 1 refills | Status: AC | PRN

## 2022-05-05 NOTE — Patient Instructions (Addendum)
Recommend to proceed with covid booster (bivalent) at your pharmacy.   For hemorrhoids: Eat high-fiber diet (fruits, vegetables, okay to use Metamucil if needed) Use over-the-counter Preparation H or you can use one of the prescribed suppositories. If you have severe symptoms let me know   GO TO THE LAB : Get the blood work     [place], [place]   Come back for a physical exam in 1 year      Hemorrhoids Las hemorroides son venas inflamadas que pueden desarrollarse: En el ano (recto). Estas se denominan hemorroides internas. Alrededor de la abertura del ano. Estas se denominan hemorroides externas. Las hemorroides pueden causar dolor, picazn o hemorragias. Generalmente no causan problemas graves. Con frecuencia mejoran al [employer] dieta, el estilo de vida y otros tratamientos Financial planner. Cules son las causas? Esta afeccin puede ser causada por lo siguiente: Tener dificultad para defecar (estreimiento). Hacer mucha fuerza (esfuerzo) para defecar. Materia fecal lquida (diarrea). Embarazo. Tener mucho sobrepeso (obesidad). Estar sentado durante largos perodos de [place]. Levantar objetos pesados u otras actividades que impliquen esfuerzo. Sexo anal. Andar en bicicleta por un largo perodo de tiempo. Cules son los signos o los sntomas? Los sntomas de esta afeccin incluyen: Social research officer, government. Picazn o irritacin en el ano. Sangrado proveniente del ano. Prdida de materia fecal. Inflamacin en la zona. Uno o ms bultos alrededor de la abertura del ano. Cmo se diagnostica? A menudo un mdico puede diagnosticar esta afeccin al observar la zona afectada. El mdico tambin puede: Optometrist un examen que implica palpar la zona con la mano enguantada (examen rectal digital). Examinar el interior de la zona anal utilizando un pequeo tubo (anoscopio). Pedir anlisis de [place]. Es posible que esto se realice si ha perdido Production manager. Solicitarle un estudio que consiste en la observacin del interior del colon utilizando un tubo flexible con una cmara en el extremo (sigmoidoscopia o colonoscopa). Cmo se trata? Esta afeccin generalmente se puede tratar en el hogar. El mdico puede indicarle que cambie de Designer, multimedia, de estilo de vida o que trate de Physicist, medical. Si esto no da resultado, se pueden realizar procedimientos para extirpar las hemorroides o reducir Publishing rights manager. Estos pueden implicar lo siguiente: Building services engineer en la base de las hemorroides para interrumpir la irrigacin de Herbalist. Inyectar un medicamento en las hemorroides para reducir Publishing rights manager. Dirigir un tipo de Teacher, early years/pre de luz hacia las hemorroides para Field seismologist que se caigan. Realizar Ardelia Mems ciruga para extirpar las hemorroides o cortar la irrigacin de [place]. Siga estas instrucciones en su casa: Comida y bebida  Consuma alimentos con alto contenido de [place]. Entre ellos cereales integrales, frijoles, frutos secos, frutas y verduras. Pregntele a su mdico acerca de tomar productos con fibra aadida en ellos (complementos de fibra). Disminuya la cantidad de grasa de la dieta. Para esto, puede hacer lo siguiente: Coma productos lcteos descremados. Coma menos carne roja. No consuma alimentos procesados. Beba suficiente lquido para Consulting civil engineer orina de color amarillo plido. Control del dolor y [place] un bao de agua tibia (bao de asiento) durante 20 minutos para Best boy. Hgalo 3 o 4 veces al da. Puede hacer esto en una baera o usar un dispositivo porttil para bao de asiento que se coloca sobre el inodoro. Si se lo indican, aplique hielo sobre la zona dolorida. Puede ser beneficioso aplicar hielo [employer] baos con agua tibia. Ponga el hielo en Ardelia Mems  bolsa plstica. Coloque una toalla entre la piel y Therapist, nutritional. Aplique el hielo durante 20 minutos, 2 o 3 veces por da. Instrucciones  generales Use los medicamentos de venta libre y los recetados solamente como se lo haya indicado el mdico. Las cremas medicinales y los medicamentos pueden usarse como se lo hayan indicado. Haga ejercicio fsico con frecuencia. Consulte al mdico qu tipos de ejercicios son mejores para usted y qu cantidad. Vaya al bao cuando sienta ganas de defecar. No espere. Evite hacer demasiada fuerza al defecar. Mantenga el ano seco y limpio. Use papel higinico hmedo o toallitas humedecidas despus de defecar. No pase mucho tiempo sentado en el inodoro. Concurra a todas las visitas de seguimiento como se lo haya indicado el mdico. Esto es importante. Comunquese con un mdico si: Tiene dolor e hinchazn que no mejoran con el tratamiento o los medicamentos. Tiene problemas para defecar. No puede defecar. Tiene dolor o hinchazn en la zona exterior de las hemorroides. Solicite ayuda de inmediato si tiene: Hemorragia que no se detiene. Resumen Las hemorroides son venas hinchadas en el ano o la zona que rodea el ano. Pueden causar dolor, picazn o sangrado. Consuma alimentos con alto contenido de [place]. Entre ellos cereales integrales, frijoles, frutos secos, frutas y verduras. Tome un bao de agua tibia (bao de asiento) durante 20 minutos para Best boy. Hgalo 3 o 4 veces al da. Esta informacin no tiene Marine scientist el consejo del mdico. Asegrese de hacerle al mdico cualquier pregunta que tenga. Document Revised: 07/05/2021 Document Reviewed: 07/05/2021 Elsevier Patient Education  [place].

## 2022-05-05 NOTE — Progress Notes (Unsigned)
   Subjective:    Patient ID: Roger Morgan., male    DOB: Jul 27, 1986, 36 y.o.   MRN: [id number]  DOS:  05/05/2022 Type of visit - description: CPX  Here for CPX. In general feeling well. Did report problems with hemorrhoids: Describes on and off pruritus at the anal area with no bleeding, swelling. Some constipation. Preparation H helps. Asthma is well controlled.   Review of Systems See above   Past Medical History:  Diagnosis Date   Allergy    Asthma    GERD (gastroesophageal reflux disease)     Past Surgical History:  Procedure Laterality Date   great toe surgery     right    HERNIA REPAIR     right    Current Outpatient Medications  Medication Instructions   albuterol (VENTOLIN HFA) 108 (90 Base) MCG/ACT inhaler 2 puffs, Inhalation, Every 6 hours PRN   azelastine (ASTELIN) 0.1 % nasal spray 2 sprays, Each Nare, At bedtime PRN, Use in each nostril as directed   budesonide-formoterol (SYMBICORT) 160-4.5 MCG/ACT inhaler 2 puffs, Inhalation, 2 times daily   montelukast (SINGULAIR) 10 MG tablet TAKE 1 TABLET BY MOUTH AT  BEDTIME   omeprazole (PRILOSEC) 20 MG capsule TAKE 1 CAPSULE BY MOUTH  DAILY BEFORE BREAKFAST       Objective:   Physical Exam BP 126/70   Pulse 76   Temp 98.1 F (36.7 C) (Oral)   Resp 16   Ht 6\' 1"  (1.854 m)   Wt 215 lb 8 oz (97.8 kg)   SpO2 97%   BMI 28.43 kg/m  General: Well developed, NAD, BMI noted Neck: No  thyromegaly  HEENT:  Normocephalic . Face symmetric, atraumatic Lungs:  CTA B Normal respiratory effort, no intercostal retractions, no accessory muscle use. Heart: RRR,  no murmur.  Abdomen:  Not distended, soft, non-tender. No rebound or rigidity.   Lower extremities: no pretibial edema bilaterally Anoscopy, procedure note: With the patient verbal permission, I proceeded to inspect the external anus and is normal.  DRE normal prostate no mass. A well-lubricated self lighted anoscopy was introduced, + internal  hemorrhoids with no active bleeding Skin: Exposed areas without rash. Not pale. Not jaundice Neurologic:  alert & oriented X3.  Speech normal, gait appropriate for age and unassisted Strength symmetric and appropriate for age.  Psych: Cognition and judgment appear intact.  Cooperative with normal attention span and concentration.  Behavior appropriate. No anxious or depressed appearing.     Assessment       Assessment Asthma GERD Allergies COVID-19:  06-2019   PLAN: Here for CPX Asthma: Good compliance with Symbicort twice daily, singular daily.  Rarely uses albuterol. GERD: Well-controlled as long as he takes PPIs, admits there is room for improvement in his lifestyle/diet. Internal hemorrhoids: See anoscopy results, explained the patient what disease, increase fiber intake, use as needed suppositories.  Call if severe symptoms. RTC 1 year  -Td 2019 -COVID-vax: booster is an option . -Labs:  CMP, FLP, CBC, TSH, free T4 -Diet and exercise discussed     Asthma: Good compliance with Symbicort, Singulair.  Uses albuterol rarely. GERD: On PPIs, doing well. Subclinical hypothyroidism: Asymptomatic, check TSH and free T4. RTC 1 year

## 2022-05-06 ENCOUNTER — Encounter: Payer: Self-pay | Admitting: Internal Medicine

## 2022-05-06 DIAGNOSIS — K648 Other hemorrhoids: Secondary | ICD-10-CM | POA: Insufficient documentation

## 2022-05-06 LAB — CBC WITH DIFFERENTIAL/PLATELET
Absolute Monocytes: 568 cells/uL (ref 200–950)
Basophils Absolute: 57 cells/uL (ref 0–200)
Basophils Relative: 0.8 %
Eosinophils Absolute: 170 cells/uL (ref 15–500)
Eosinophils Relative: 2.4 %
HCT: 41.4 % (ref 38.5–50.0)
Hemoglobin: 14.8 g/dL (ref 13.2–17.1)
Lymphs Abs: 1860 cells/uL (ref 850–3900)
MCH: 33 pg (ref 27.0–33.0)
MCHC: 35.7 g/dL (ref 32.0–36.0)
MCV: 92.4 fL (ref 80.0–100.0)
MPV: 10.6 fL (ref 7.5–12.5)
Monocytes Relative: 8 %
Neutro Abs: 4445 cells/uL (ref 1500–7800)
Neutrophils Relative %: 62.6 %
Platelets: 308 10*3/uL (ref 140–400)
RBC: 4.48 10*6/uL (ref 4.20–5.80)
RDW: 12 % (ref 11.0–15.0)
Total Lymphocyte: 26.2 %
WBC: 7.1 10*3/uL (ref 3.8–10.8)

## 2022-05-06 LAB — TSH: TSH: 6.29 mIU/L — ABNORMAL HIGH (ref 0.40–4.50)

## 2022-05-06 LAB — COMPREHENSIVE METABOLIC PANEL
AG Ratio: 2.1 (calc) (ref 1.0–2.5)
ALT: 34 U/L (ref 9–46)
AST: 18 U/L (ref 10–40)
Albumin: 4.8 g/dL (ref 3.6–5.1)
Alkaline phosphatase (APISO): 62 U/L (ref 36–130)
BUN: 17 mg/dL (ref 7–25)
CO2: 25 mmol/L (ref 20–32)
Calcium: 9.8 mg/dL (ref 8.6–10.3)
Chloride: 102 mmol/L (ref 98–110)
Creat: 1.21 mg/dL (ref 0.60–1.26)
Globulin: 2.3 g/dL (calc) (ref 1.9–3.7)
Glucose, Bld: 92 mg/dL (ref 65–99)
Potassium: 4.1 mmol/L (ref 3.5–5.3)
Sodium: 139 mmol/L (ref 135–146)
Total Bilirubin: 0.6 mg/dL (ref 0.2–1.2)
Total Protein: 7.1 g/dL (ref 6.1–8.1)

## 2022-05-06 LAB — LIPID PANEL
Cholesterol: 232 mg/dL — ABNORMAL HIGH (ref <200)
HDL: 54 mg/dL (ref >=40)
LDL Cholesterol (Calc): 134 mg/dL (calc) — ABNORMAL HIGH
Non-HDL Cholesterol (Calc): 178 mg/dL (calc) — ABNORMAL HIGH (ref <130)
Total CHOL/HDL Ratio: 4.3 (calc) (ref <5.0)
Triglycerides: 287 mg/dL — ABNORMAL HIGH (ref <150)

## 2022-05-06 LAB — T4, FREE: Free T4: 1.3 ng/dL (ref 0.8–1.8)

## 2022-05-06 NOTE — Assessment & Plan Note (Signed)
Here for CPX Asthma: Good compliance with Symbicort twice daily, singular daily.  Rarely uses albuterol. GERD: Well-controlled as long as he takes PPIs, admits there is room for improvement in his lifestyle/diet. Internal hemorrhoids: New problem, See anoscopy results, explained the patient what disease, increase fiber intake, use as needed suppositories.  Call if severe symptoms. RTC 1 year

## 2022-05-06 NOTE — Assessment & Plan Note (Signed)
-  Td 2019 -COVID-vax: booster is an option . -Labs:  CMP, FLP, CBC, TSH, free T4 -Diet and exercise discussed

## 2022-05-10 MED ORDER — LEVOTHYROXINE SODIUM 50 MCG PO TABS: 50.0000 ug | ORAL_TABLET | Freq: Every day | ORAL | 1 refills | Status: DC

## 2022-05-10 NOTE — Addendum Note (Signed)
Addended byConrad [place] D on: 05/10/2022 10:04 AM   Modules accepted: Orders

## 2022-05-27 ENCOUNTER — Other Ambulatory Visit: Payer: Self-pay | Admitting: Internal Medicine

## 2022-07-05 ENCOUNTER — Telehealth: Payer: Self-pay | Admitting: Internal Medicine

## 2022-07-05 NOTE — Telephone Encounter (Signed)
Patient brought in CPE insurance form Place in paz bin up front Patient would like it to be faxed to number on [phone]

## 2022-07-06 ENCOUNTER — Other Ambulatory Visit: Payer: Self-pay | Admitting: Internal Medicine

## 2022-07-06 NOTE — Telephone Encounter (Signed)
Form signed and faxed to [facility] at [phone]. Form sent for scanning.

## 2022-07-06 NOTE — Telephone Encounter (Signed)
Spoke w/ Pt- he will come by to sign form (placed at front desk)- informed I would fax in for him once I have that. Pt verbalized understanding.

## 2022-07-13 ENCOUNTER — Other Ambulatory Visit: Payer: Self-pay | Admitting: Internal Medicine

## 2022-07-20 ENCOUNTER — Other Ambulatory Visit: Payer: Self-pay | Admitting: Internal Medicine

## 2022-08-02 ENCOUNTER — Other Ambulatory Visit: Payer: Self-pay | Admitting: Internal Medicine

## 2022-11-20 ENCOUNTER — Encounter: Payer: Self-pay | Admitting: Internal Medicine

## 2023-02-15 ENCOUNTER — Other Ambulatory Visit: Payer: Self-pay | Admitting: Internal Medicine

## 2023-03-05 ENCOUNTER — Other Ambulatory Visit: Payer: Self-pay | Admitting: Internal Medicine

## 2023-05-11 ENCOUNTER — Telehealth: Payer: Self-pay | Admitting: Internal Medicine

## 2023-05-11 ENCOUNTER — Ambulatory Visit (INDEPENDENT_AMBULATORY_CARE_PROVIDER_SITE_OTHER): Payer: Managed Care, Other (non HMO) | Admitting: Internal Medicine

## 2023-05-11 ENCOUNTER — Encounter: Payer: Self-pay | Admitting: Internal Medicine

## 2023-05-11 VITALS — BP 122/72 | HR 80 | Temp 98.0°F | Resp 16 | Ht 71.0 in | Wt 221.0 lb

## 2023-05-11 DIAGNOSIS — E039 Hypothyroidism, unspecified: Secondary | ICD-10-CM

## 2023-05-11 DIAGNOSIS — M109 Gout, unspecified: Secondary | ICD-10-CM

## 2023-05-11 DIAGNOSIS — E785 Hyperlipidemia, unspecified: Secondary | ICD-10-CM

## 2023-05-11 DIAGNOSIS — Z0000 Encounter for general adult medical examination without abnormal findings: Secondary | ICD-10-CM

## 2023-05-11 LAB — CBC
HCT: 42 % (ref 38.5–50.0)
MCV: 92.1 fL (ref 80.0–100.0)
RDW: 12.3 % (ref 11.0–15.0)

## 2023-05-11 MED ORDER — PREDNISONE 10 MG PO TABS: ORAL_TABLET | ORAL | 0 refills | Status: DC

## 2023-05-11 MED ORDER — COLCHICINE 0.6 MG PO CAPS: 1.0000 | ORAL_CAPSULE | Freq: Two times a day (BID) | ORAL | 0 refills | Status: DC | PRN

## 2023-05-11 NOTE — Progress Notes (Signed)
Subjective:    Patient ID: Roger Albee., male    DOB: 10/07/1986, 37 y.o.   MRN: [id number]  DOS:  05/11/2023 Type of visit - description: cpx  Here for CPX 2 weeks ago developed pain, redness, swelling and warmness around the left great toe. Went to another office, inflammation was confirmed. Was offered treatment but he declined. At this point area looks better but is not completely well.   Review of Systems  Other than above, a 14 point review of systems is negative    Past Medical History:  Diagnosis Date   Allergy    Asthma    GERD (gastroesophageal reflux disease)     Past Surgical History:  Procedure Laterality Date   great toe surgery     right    HERNIA REPAIR     right   Social History   Socioeconomic History   Marital status: Married    Spouse name: Not on file   Number of children: 2   Years of education: Not on file   Highest education level: Not on file  Occupational History   Occupation: painter   Tobacco Use   Smoking status: Former    Types: Cigarettes    Quit date: 10/19/2010    Years since quitting: 12.5   Smokeless tobacco: Never  Substance and Sexual Activity   Alcohol use: Yes    Comment:  rarely   Drug use: Yes    Types: Marijuana    Comment: not daily   Sexual activity: Not on file  Other Topics Concern   Not on file  Social History Narrative   Originally from [place], [place]   Children    2 daughters: 2007, 2013   Son 2021         Social Determinants of Corporate investment banker Strain: Not on file  Food Insecurity: Not on file  Transportation Needs: Not on file  Physical Activity: Not on file  Stress: Not on file  Social Connections: Not on file  Intimate Partner Violence: Not on file    Current Outpatient Medications  Medication Instructions   albuterol (VENTOLIN HFA) 108 (90 Base) MCG/ACT inhaler USE 2 INHALATIONS BY MOUTH EVERY 6 HOURS AS NEEDED FOR WHEEZING  OR SHORTNESS OF BREATH   azelastine  (ASTELIN) 0.1 % nasal spray 2 sprays, Each Nare, At bedtime PRN, Use in each nostril as directed   Colchicine (MITIGARE) 0.6 mg, Oral, 2 times daily PRN   hydrocortisone (ANUSOL-HC) 25 mg, Rectal, 2 times daily PRN   montelukast (SINGULAIR) 10 mg, Oral, Daily at bedtime   omeprazole (PRILOSEC) 20 MG capsule TAKE 1 CAPSULE BY MOUTH DAILY  BEFORE BREAKFAST   predniSONE (DELTASONE) 10 MG tablet 4 tablets x 2 days, 3 tabs x 2 days, 2 tabs x 2 days, 1 tab x 2 days   SYMBICORT 160-4.5 MCG/ACT inhaler INHALE 2 INHALATIONS BY  MOUTH INTO THE LUNGS IN THE MORNING AND AT BEDTIME       Objective:   Physical Exam BP 122/72 (BP Location: Left Arm, Patient Position: Sitting, Cuff Size: Normal)   Pulse 80   Temp 98 F (36.7 C)   Resp 16   Ht 5\' 11"  (1.803 m)   Wt 221 lb (100.2 kg)   SpO2 97%   BMI 30.82 kg/m  General: Well developed, NAD, BMI noted Neck: No  thyromegaly  HEENT:  Normocephalic . Face symmetric, atraumatic Lungs:  CTA B Normal respiratory effort, no intercostal retractions,  no accessory muscle use. Heart: RRR,  no murmur.  Abdomen:  Not distended, soft, non-tender. No rebound or rigidity.   Lower extremities: no pretibial edema bilaterally. R great toe normal L great toe: Minimal swelling, some warmness, no redness. Skin: Exposed areas without rash. Not pale. Not jaundice Neurologic:  alert & oriented X3.  Speech normal, gait appropriate for age and unassisted Strength symmetric and appropriate for age.  Psych: Cognition and judgment appear intact.  Cooperative with normal attention span and concentration.  Behavior appropriate. No anxious or depressed appearing.     Assessment     Assessment Hypothyroidism Asthma GERD Allergies Internal hemorrhoids.  Anoscope 04/2022   PLAN: Here for CPX - Td 2019 -COVID booster recommended. -Labs:  CMP FLP CBC TSH uric acid -Diet and exercise discussed  Podagra  gout: First episode of pellagra on clinical grounds.  He  is getting better but not completely well. Plan: Check uric acid, prednisone for few days, colchicine. Patient education: Gout could be a recurrent issue, if that happens again to start colchicine and let me know.  Diet discussed.  Reassess in 6 months. Hypothyroidism: Last year TSH was consistently elevated, took Synthroid but never refilled the prescription or came for a follow-up.  Plan: Check TSH, aware that if start Synthroid will need regular blood work Asthma: Well-controlled, hardly ever uses a rescue inhaler GERD: Well-controlled as long as he takes his medication. RTC 6 months

## 2023-05-11 NOTE — Patient Instructions (Addendum)
You have gout.  Take prednisone for few days  Take colchicine twice daily for few days and use it as needed if you have another gout episode.  Watch your diet    GO TO THE LAB : Get the blood work     GO TO THE FRONT DESK, PLEASE SCHEDULE YOUR APPOINTMENTS Come back for   a checkup in 6 months.  Gota Gout  La gota es una afeccin que causa la hinchazn dolorosa de las articulaciones. La gota es un tipo de inflamacin de las articulaciones (artritis). Esta afeccin es causada por la acumulacin de cido rico en el cuerpo. El cido rico es una sustancia qumica que se forma cuando el cuerpo descompone sustancias llamadas purinas. Las purinas son importantes para la fabricacin de las protenas del cuerpo. Cuando el cuerpo tiene un exceso de cido rico, se pueden formar cristales con punta y Production manager interior de las articulaciones. Esto provoca dolor e hinchazn. Las crisis de gota pueden ocurrir rpidamente y ser muy dolorosas (gota [place]). Con el tiempo, las crisis pueden afectar ms articulaciones y volverse ms frecuentes (gota crnica). La gota tambin puede provocar la acumulacin de cido rico debajo de la piel y en los riones. Cules son las causas? Esta afeccin es causada por la acumulacin de cido rico en la sangre. Esto puede ocurrir debido a lo siguiente: Los riones no [employer] cantidad suficiente de cido rico de Risk manager. Esta es la causa ms frecuente. El cuerpo produce demasiado cido rico. Esto puede ocurrir con algunos tipos de cncer y tratamientos para Management consultant. Tambin puede ocurrir si el cuerpo est descomponiendo demasiados glbulos rojos (anemia hemoltica). Come demasiados alimentos ricos en purinas. Estos alimentos incluyen vsceras y [employer]. Las bebidas alcohlicas, en especial la cerveza, tambin son ricas en purinas. Neomia Dear crisis de gota puede desencadenarse debido a un traumatismo o Librarian, academic. Qu incrementa el riesgo? Los  siguientes factores pueden hacer que sea ms propenso a Aeronautical engineer afeccin: Tener antecedentes familiares de gota. Ser hombre de [employer]. Ser mujer y haber atravesado la menopausia. Usar determinados medicamentos, como aspirina, ciclosporina, diurticos, levodopa y niacina. Haber tenido un trasplante de rgano. Tener ciertas afecciones, tales como: Obesidad. Intoxicacin con plomo. Enfermedad renal. Una afeccin cutnea llamada psoriasis. Otros factores incluyen los siguientes: Perder peso con demasiada rapidez. Estar deshidratado. Beber alcohol con frecuencia, en especial, cerveza. Tomar frecuentemente bebidas endulzadas con un tipo de azcar llamado fructosa. Cules son los signos o sntomas? Un ataque de gota aguda sucede de repente. Normalmente ocurre solo en Risk analyst. El lugar ms comn es el pulgar del pie. Las crisis a menudo comienzan por la noche. Tambin pueden verse afectadas las articulaciones de los pies, los tobillos, las rodillas, los dedos de la mano, las muecas o los codos. Los sntomas de esta afeccin pueden incluir los siguientes: Dolor intenso. Calor. Hinchazn. Entumecimiento. Dolor a Insurance claims handler. Se puede sentir mucho dolor al tacto en la articulacin afectada. Piel brillosa, roja o morada. Grant Ruts y escalofros. La gota crnica puede provocar sntomas con ms frecuencia. Pueden verse involucradas ms articulaciones. Es posible que tambin tenga bultos blancos o amarillos (tofos) en las manos o los pies, o en otras zonas cercanas a las articulaciones. Cmo se diagnostica? Esta afeccin se diagnostica en funcin de sus sntomas, antecedentes mdicos y de un examen fsico. Pueden hacerle estudios, por ejemplo: Anlisis de sangre para [employer] de cido rico. Psychologist, counselling de lquido sinovial con una aguja delgada (aspiracin) para buscar  la presencia de cristales de cido rico. Radiografas para observar la articulacin daada. Cmo  se trata? El tratamiento para esta afeccin tiene dos fases: tratar un ataque agudo y prevenir ataques futuros. El tratamiento de la gota aguda puede incluir medicamentos para reducir Chief Technology Officer y la hinchazn, por ejemplo: Antiinflamatorios no esteroideos (AINE), como el ibuprofeno. Corticoesteroides. Estos son medicamentos antiinflamatorios fuertes que se pueden tomar por va oral por boca o se pueden inyectar en una articulacin. Colchicina. Este medicamento [employer]ée [employer]uder dolor y la hinchazn cuando se [place] inmediatamente despus de Neomia Dear crisis. Puede administrarse por la boca o por una va intravenosa. El tratamiento preventivo puede incluir lo siguiente: El uso diario de dosis ms bajas de AINE o colchicina. El uso de un medicamento que reduce los niveles de cido rico en la sangre, como el alopurinol. Cambios en la dieta. Es posible que deba consultar a un nutricionista acerca de lo que debe comer y beber para Cytogeneticist. Siga estas indicaciones en su casa: Durante una crisis de gota  Si se lo indican, aplique hielo sobre la zona afectada. Para hacer esto: Ponga el hielo en una bolsa plstica. Coloque una toalla entre la piel y Copy. Aplique el hielo durante 20 minutos, 2 a 3 veces por da. Retire el hielo si la piel se pone de color rojo brillante. Esto es [employer]. Si no puede sentir dolor, calor o fro, tiene un mayor riesgo de que se dae la zona. Levante (eleve) la articulacin afectada por encima del nivel del corazn tantas veces como le sea posible. Haga reposo todo el tiempo que pueda. Si la articulacin afectada se encuentra en la pierna, quiz deba usar muletas. Siga las indicaciones del mdico respecto de las restricciones en las comidas o las bebidas. Cmo evitar las crisis de gota en el futuro Siga una dieta baja en purinas como se lo haya indicado el nutricionista o el mdico. Evite alimentos y bebidas con alto contenido de purinas, por ejemplo, hgado, rin,  anchoas, esprragos, arenque, hongos, mejillones y Financial controller. Mantenga un peso saludable o pierda peso si tiene sobrepeso. Si quiere perder peso, hable con el mdico. No pierda peso con demasiada rapidez. Comience o siga un programa de actividad fsica como se lo haya indicado el mdico. Comida y bebida Evite tomar bebidas que contengan fructosa. Beba suficiente lquido para Photographer orina de color amarillo plido. Si bebe alcohol: Limite la cantidad que bebe a lo siguiente: De 0 a 1 medida por da para las mujeres que no estn embarazadas. De 0 a 2 medidas por da para los hombres. Sepa cunta cantidad de alcohol hay en las bebidas. En los [address], una medida equivale a una botella de cerveza de 12 oz (355 ml), un vaso de vino de 5 oz (148 ml) o un vaso de una bebida alcohlica de alta graduacin de 1 oz (44 ml). Indicaciones generales Use los medicamentos de venta libre y los recetados solamente como se lo haya indicado el mdico. Pregntele al mdico si el medicamento recetado le impide conducir o usar [place]. Retome sus actividades normales segn lo indicado por el mdico. Pregntele al mdico qu actividades son seguras para usted. Concurra a todas las visitas de seguimiento. Esto es importante. Dnde obtener ms informacin [employer] of Health (Institutos Nacionales de la Salud: [email or web address].net/ Comunquese con un mdico si tiene: Otra crisis de gota. Sntomas de [place] crisis de gota que continan despus de [address] de [place]. Efectos secundarios provocados por los  medicamentos. Escalofros o fiebre. Dolor urente al [employer]. Dolor en la parte inferior de la espalda o el abdomen. Busque ayuda de inmediato si: Siente dolor intenso o incontrolable. No puede orinar. Resumen La gota es la hinchazn dolorosa de las articulaciones causada por tener demasiado cido rico en el cuerpo. El lugar ms habitual donde se manifiesta la gota es el dedo gordo del pie,  pero tambin pueden verse afectadas otras articulaciones del cuerpo. Los [employer] y los cambios en la dieta pueden ayudar a Automotive engineer y tratar las crisis de [place]. Esta informacin no tiene Theme park manager el consejo del mdico. Asegrese de hacerle al mdico cualquier pregunta que tenga. Document Revised: 09/16/2021 Document Reviewed: 09/16/2021 Elsevier Patient Education  2024 [employer].

## 2023-05-11 NOTE — Assessment & Plan Note (Signed)
Here for CPX Podagra  gout: First episode of pellagra on clinical grounds.  He is getting better but not completely well. Plan: Check uric acid, prednisone for few days, colchicine. Patient education: Gout could be a recurrent issue, if that happens again to start colchicine and let me know.  Diet discussed.  Reassess in 6 months. Hypothyroidism: Last year TSH was consistently elevated, took Synthroid but never refilled the prescription or came for a follow-up.  Plan: Check TSH, aware that if start Synthroid will need regular blood work Asthma: Well-controlled, hardly ever uses a rescue inhaler GERD: Well-controlled as long as he takes his medication. RTC 6 months

## 2023-05-11 NOTE — Telephone Encounter (Signed)
Pt said the pharmacy has not received the prednisone and he is getting the run around. Pt would like assistance in checking with the pharmacy to see what the issue Is.

## 2023-05-11 NOTE — Assessment & Plan Note (Signed)
-   Td 2019 -COVID booster recommended. -Labs:  CMP FLP CBC TSH uric acid -Diet and exercise discussed

## 2023-05-12 LAB — COMPREHENSIVE METABOLIC PANEL
AG Ratio: 1.6 (calc) (ref 1.0–2.5)
ALT: 68 U/L — ABNORMAL HIGH (ref 9–46)
AST: 30 U/L (ref 10–40)
Albumin: 4.6 g/dL (ref 3.6–5.1)
Alkaline phosphatase (APISO): 60 U/L (ref 36–130)
BUN: 12 mg/dL (ref 7–25)
CO2: 23 mmol/L (ref 20–32)
Calcium: 9.9 mg/dL (ref 8.6–10.3)
Chloride: 99 mmol/L (ref 98–110)
Creat: 0.95 mg/dL (ref 0.60–1.26)
Globulin: 2.8 g/dL (calc) (ref 1.9–3.7)
Glucose, Bld: 93 mg/dL (ref 65–99)
Potassium: 4.1 mmol/L (ref 3.5–5.3)
Sodium: 137 mmol/L (ref 135–146)
Total Bilirubin: 0.6 mg/dL (ref 0.2–1.2)
Total Protein: 7.4 g/dL (ref 6.1–8.1)

## 2023-05-12 LAB — CBC
Hemoglobin: 14.8 g/dL (ref 13.2–17.1)
MCH: 32.5 pg (ref 27.0–33.0)
MCHC: 35.2 g/dL (ref 32.0–36.0)
MPV: 10.4 fL (ref 7.5–12.5)
Platelets: 357 10*3/uL (ref 140–400)
RBC: 4.56 10*6/uL (ref 4.20–5.80)
WBC: 7.3 10*3/uL (ref 3.8–10.8)

## 2023-05-12 LAB — LIPID PANEL
Cholesterol: 251 mg/dL — ABNORMAL HIGH (ref <200)
HDL: 54 mg/dL (ref >=40)
Non-HDL Cholesterol (Calc): 197 mg/dL (calc) — ABNORMAL HIGH (ref <130)
Total CHOL/HDL Ratio: 4.6 (calc) (ref <5.0)
Triglycerides: 448 mg/dL — ABNORMAL HIGH (ref <150)

## 2023-05-12 LAB — TSH: TSH: 10.26 mIU/L — ABNORMAL HIGH (ref 0.40–4.50)

## 2023-05-12 LAB — URIC ACID: Uric Acid, Serum: 8.6 mg/dL — ABNORMAL HIGH (ref 4.0–8.0)

## 2023-05-14 ENCOUNTER — Other Ambulatory Visit: Payer: Self-pay | Admitting: Internal Medicine

## 2023-05-14 MED ORDER — LEVOTHYROXINE SODIUM 100 MCG PO TABS: 100.0000 ug | ORAL_TABLET | Freq: Every day | ORAL | 1 refills | Status: DC

## 2023-05-14 NOTE — Telephone Encounter (Signed)
Patient got prednisone.  Pharmacy just did not have it ready at time of pt call.

## 2023-05-14 NOTE — Addendum Note (Signed)
Addended by: Wilford Corner on: 05/14/2023 09:05 AM   Modules accepted: Orders

## 2023-05-24 ENCOUNTER — Telehealth: Payer: Self-pay

## 2023-05-24 NOTE — Telephone Encounter (Signed)
Patient states he got [facility] bill [facility] due to he informed front desk that labs need to go to [facility] because his wife works for [facility]. I informed patient that he needs to inform cma or provider at each visit that labs need to go to specific lab agency and verify with lab tech before lab draw in future. Patient verbalized understanding with no further questions.

## 2023-06-08 ENCOUNTER — Other Ambulatory Visit: Payer: Self-pay | Admitting: Internal Medicine

## 2023-07-02 ENCOUNTER — Encounter: Payer: Self-pay | Admitting: Internal Medicine

## 2023-07-02 ENCOUNTER — Ambulatory Visit: Payer: Managed Care, Other (non HMO) | Admitting: Internal Medicine

## 2023-07-02 ENCOUNTER — Telehealth: Payer: Self-pay

## 2023-07-02 VITALS — BP 130/74 | HR 64 | Temp 98.1°F | Resp 16 | Ht 71.0 in | Wt 224.1 lb

## 2023-07-02 DIAGNOSIS — E039 Hypothyroidism, unspecified: Secondary | ICD-10-CM | POA: Diagnosis not present

## 2023-07-02 DIAGNOSIS — E785 Hyperlipidemia, unspecified: Secondary | ICD-10-CM

## 2023-07-02 DIAGNOSIS — M109 Gout, unspecified: Secondary | ICD-10-CM

## 2023-07-02 NOTE — Progress Notes (Unsigned)
   Subjective:    Patient ID: Roger Albee., male    DOB: [birth year], 37 y.o.   MRN: [id number]  DOS:  07/02/2023 Type of visit - description: Follow-up  Multiple issues discussed. Feeling great. Exercising more.  Review of Systems See above   Past Medical History:  Diagnosis Date   Allergy    Asthma    GERD (gastroesophageal reflux disease)     Past Surgical History:  Procedure Laterality Date   great toe surgery     right    HERNIA REPAIR     right    Current Outpatient Medications  Medication Instructions   albuterol (VENTOLIN HFA) 108 (90 Base) MCG/ACT inhaler USE 2 INHALATIONS BY MOUTH EVERY 6 HOURS AS NEEDED FOR WHEEZING  OR SHORTNESS OF BREATH   azelastine (ASTELIN) 0.1 % nasal spray 2 sprays, Each Nare, At bedtime PRN, Use in each nostril as directed   budesonide-formoterol (SYMBICORT) 160-4.5 MCG/ACT inhaler 2 puffs, Inhalation, 2 times daily   Colchicine 0.6 mg, Oral, 2 times daily PRN   hydrocortisone (ANUSOL-HC) 25 mg, Rectal, 2 times daily PRN   levothyroxine (SYNTHROID) 100 MCG tablet TAKE 1 TABLET(100 MCG) BY MOUTH DAILY   montelukast (SINGULAIR) 10 mg, Oral, Daily at bedtime   omeprazole (PRILOSEC) 20 MG capsule TAKE 1 CAPSULE BY MOUTH DAILY  BEFORE BREAKFAST   predniSONE (DELTASONE) 10 MG tablet 4 tablets x 2 days, 3 tabs x 2 days, 2 tabs x 2 days, 1 tab x 2 days       Objective:   Physical Exam BP 130/74   Pulse 64   Temp 98.1 F (36.7 C) (Oral)   Resp 16   Ht 5\' 11"  (1.803 m)   Wt 224 lb 2 oz (101.7 kg)   SpO2 97%   BMI 31.26 kg/m  General:   Well developed, NAD, BMI noted. HEENT:  Normocephalic . Face symmetric, atraumatic Skin: Not pale. Not jaundice Neurologic:  alert & oriented X3.  Speech normal, gait appropriate for age and unassisted Psych--  Cognition and judgment appear intact.  Cooperative with normal attention span and concentration.  Behavior appropriate. No anxious or depressed appearing.      Assessment     Assessment Hypothyroidism Asthma GERD Allergies Internal hemorrhoids.  Anoscope 04/2022 CV risk: Elevated cholesterol, no tobacco, no FH  PLAN: Hypothyroidism: Since LOV is started Synthroid, reports he feels great, more energetic, less tired.  Recheck TSH. Gout: Uric acid was 8.6 confirming the diagnosis of gout.  Explained that this could be a recurrent disease, for now stay on colchicine as needed, diet extensively discussed. High cholesterol: LDL was 134.  Dietary advice provided, in the last few weeks he is exercising regularly.  Check FLP. Vaccine advice provided RTC 6 months.

## 2023-07-02 NOTE — Telephone Encounter (Signed)
Physical form completed and faxed back to [facility] at [phone]. Form sent for scanning.   Received fax confirmation.

## 2023-07-02 NOTE — Patient Instructions (Addendum)
Vaccines I recommend: Covid booster Flu shot this fall     GO TO THE LAB : Get the blood work     GO TO THE FRONT DESK, PLEASE SCHEDULE YOUR APPOINTMENTS Come back for a checkup in 6 months

## 2023-07-03 LAB — TSH: TSH: 2.99 u[IU]/mL (ref 0.450–4.500)

## 2023-07-03 LAB — LIPID PANEL
Chol/HDL Ratio: 5.1 ratio — ABNORMAL HIGH (ref 0.0–5.0)
Cholesterol, Total: 233 mg/dL — ABNORMAL HIGH (ref 100–199)
HDL: 46 mg/dL (ref >39)
LDL Chol Calc (NIH): 130 mg/dL — ABNORMAL HIGH (ref 0–99)
Triglycerides: 322 mg/dL — ABNORMAL HIGH (ref 0–149)
VLDL Cholesterol Cal: 57 mg/dL — ABNORMAL HIGH (ref 5–40)

## 2023-07-03 NOTE — Assessment & Plan Note (Signed)
Hypothyroidism: Since LOV is started Synthroid, reports he feels great, more energetic, less tired.  Recheck TSH. Gout: Uric acid was 8.6 confirming the diagnosis of gout.  Explained that this could be a recurrent disease, for now stay on colchicine as needed, diet extensively discussed. High cholesterol: LDL was 134.  Dietary advice provided, in the last few weeks he is exercising regularly.  Check FLP. Vaccine advice provided RTC 6 months.

## 2023-07-05 MED ORDER — LEVOTHYROXINE SODIUM 100 MCG PO TABS: 100.0000 ug | ORAL_TABLET | Freq: Every day | ORAL | 1 refills | Status: DC

## 2023-07-05 NOTE — Addendum Note (Signed)
Addended byConrad [place] D on: 07/05/2023 09:59 AM   Modules accepted: Orders

## 2023-08-06 ENCOUNTER — Other Ambulatory Visit: Payer: Self-pay | Admitting: Internal Medicine

## 2023-08-09 ENCOUNTER — Other Ambulatory Visit: Payer: Self-pay | Admitting: Internal Medicine

## 2023-09-03 ENCOUNTER — Other Ambulatory Visit: Payer: Self-pay | Admitting: Internal Medicine

## 2023-10-02 ENCOUNTER — Other Ambulatory Visit: Payer: Self-pay

## 2023-10-02 MED ORDER — COLCHICINE 0.6 MG PO CAPS: 1.0000 | ORAL_CAPSULE | Freq: Two times a day (BID) | ORAL | 0 refills | Status: DC | PRN

## 2023-10-28 ENCOUNTER — Other Ambulatory Visit: Payer: Self-pay | Admitting: Internal Medicine

## 2023-11-12 ENCOUNTER — Encounter: Payer: Self-pay | Admitting: Internal Medicine

## 2023-11-12 ENCOUNTER — Ambulatory Visit: Payer: Managed Care, Other (non HMO) | Admitting: Internal Medicine

## 2023-11-22 ENCOUNTER — Other Ambulatory Visit: Payer: Self-pay | Admitting: Internal Medicine

## 2023-12-03 ENCOUNTER — Other Ambulatory Visit: Payer: Self-pay | Admitting: Internal Medicine

## 2023-12-30 ENCOUNTER — Other Ambulatory Visit: Payer: Self-pay | Admitting: Internal Medicine

## 2024-01-02 ENCOUNTER — Ambulatory Visit: Payer: Managed Care, Other (non HMO) | Admitting: Internal Medicine

## 2024-01-07 ENCOUNTER — Encounter: Payer: Self-pay | Admitting: Internal Medicine

## 2024-01-07 ENCOUNTER — Ambulatory Visit: Payer: Managed Care, Other (non HMO) | Admitting: Internal Medicine

## 2024-01-07 VITALS — BP 116/68 | HR 68 | Temp 97.8°F | Resp 16 | Ht 71.0 in | Wt 217.2 lb

## 2024-01-07 DIAGNOSIS — K219 Gastro-esophageal reflux disease without esophagitis: Secondary | ICD-10-CM

## 2024-01-07 DIAGNOSIS — J4520 Mild intermittent asthma, uncomplicated: Secondary | ICD-10-CM | POA: Diagnosis not present

## 2024-01-07 DIAGNOSIS — E785 Hyperlipidemia, unspecified: Secondary | ICD-10-CM | POA: Diagnosis not present

## 2024-01-07 DIAGNOSIS — E039 Hypothyroidism, unspecified: Secondary | ICD-10-CM | POA: Diagnosis not present

## 2024-01-07 MED ORDER — OMEPRAZOLE 40 MG PO CPDR: 40.0000 mg | DELAYED_RELEASE_CAPSULE | Freq: Every day | ORAL | 1 refills | Status: DC

## 2024-01-07 NOTE — Patient Instructions (Addendum)
Vaccines I recommend: Covid booster Flu shot     GO TO THE LAB : Get the blood work     Next visit with me May 2025, physical exam    Please schedule it at the front desk

## 2024-01-07 NOTE — Progress Notes (Unsigned)
   Subjective:    Patient ID: Roger Albee., male    DOB: 17-Dec-1985, 37 y.o.   MRN: [id number]  DOS:  01/07/2024 Type of visit - description: Follow-up  Routine follow-up, feeling well. Still has occasional acid reflux symptoms, increased omeprazole?.   Review of Systems See above   Past Medical History:  Diagnosis Date   Allergy    Asthma    GERD (gastroesophageal reflux disease)     Past Surgical History:  Procedure Laterality Date   great toe surgery     right    HERNIA REPAIR     right    Current Outpatient Medications  Medication Instructions   albuterol (VENTOLIN HFA) 108 (90 Base) MCG/ACT inhaler 2 puffs, Inhalation, Every 6 hours PRN   azelastine (ASTELIN) 0.1 % nasal spray 2 sprays, Each Nare, At bedtime PRN, Use in each nostril as directed   budesonide-formoterol (SYMBICORT) 160-4.5 MCG/ACT inhaler 2 puffs, Inhalation, 2 times daily   Colchicine 0.6 mg, Oral, 2 times daily PRN   hydrocortisone (ANUSOL-HC) 25 mg, Rectal, 2 times daily PRN   levothyroxine (SYNTHROID) 100 mcg, Oral, Daily before breakfast   montelukast (SINGULAIR) 10 mg, Oral, Daily at bedtime   omeprazole (PRILOSEC) 20 mg, Oral, Daily before breakfast   predniSONE (DELTASONE) 10 MG tablet 4 tablets x 2 days, 3 tabs x 2 days, 2 tabs x 2 days, 1 tab x 2 days       Objective:   Physical Exam BP 116/68   Pulse 68   Temp 97.8 F (36.6 C) (Oral)   Resp 16   Ht 5\' 11"  (1.803 m)   Wt 217 lb 4 oz (98.5 kg)   SpO2 97%   BMI 30.30 kg/m  General:   Well developed, NAD, BMI noted. HEENT:  Normocephalic . Face symmetric, atraumatic Lungs:  CTA B Normal respiratory effort, no intercostal retractions, no accessory muscle use. Heart: RRR,  no murmur.  Lower extremities: no pretibial edema bilaterally  Skin: Not pale. Not jaundice Neurologic:  alert & oriented X3.  Speech normal, gait appropriate for age and unassisted Psych--  Cognition and judgment appear intact.  Cooperative with  normal attention span and concentration.  Behavior appropriate. No anxious or depressed appearing.      Assessment    Assessment Hypothyroidism Asthma GERD Gout: Dx 04/2023 Allergies Internal hemorrhoids.  Anoscope 04/2022 CV risk: Elevated cholesterol, no tobacco, no FH  PLAN: Hypothyroidism: Last TSH satisfactory, reports good compliance with levothyroxine.  Check TSH Mild dyslipidemia: Last cholesterol 233, triglycerides 322.  Calculated risk at 1.3%. Has changed his diet, eating healthier, increase exercise, request recheck FLP. GERD: Still has occasional symptoms, particularly if he eats spicy foods.  I agreed to increase omeprazole to 40 mg every day  but rec  avoid triggers of acid reflux. Asthma: Well-controlled on Symbicort twice daily, hardly ever needs albuterol Vaccine advice: Reluctant to take flu or COVID vaccines RTC 04/2024 CPX     7 Hypothyroidism: Since LOV is started Synthroid, reports he feels great, more energetic, less tired.  Recheck TSH. Gout: Uric acid was 8.6 confirming the diagnosis of gout.  Explained that this could be a recurrent disease, for now stay on colchicine as needed, diet extensively discussed. High cholesterol: LDL was 134.  Dietary advice provided, in the last few weeks he is exercising regularly.  Check FLP. Vaccine advice provided RTC 6 months.

## 2024-01-08 LAB — LIPID PANEL
Chol/HDL Ratio: 4.8 {ratio} (ref 0.0–5.0)
Cholesterol, Total: 243 mg/dL — ABNORMAL HIGH (ref 100–199)
HDL: 51 mg/dL (ref >39)
LDL Chol Calc (NIH): 161 mg/dL — ABNORMAL HIGH (ref 0–99)
Triglycerides: 169 mg/dL — ABNORMAL HIGH (ref 0–149)
VLDL Cholesterol Cal: 31 mg/dL (ref 5–40)

## 2024-01-08 LAB — TSH: TSH: 4.19 u[IU]/mL (ref 0.450–4.500)

## 2024-01-08 NOTE — Assessment & Plan Note (Signed)
Hypothyroidism: Last TSH satisfactory, reports good compliance with levothyroxine.  Check TSH Mild dyslipidemia: Last cholesterol 233, triglycerides 322.  Calculated risk at 1.3%. Has changed his diet, eating healthier, increase exercise, request recheck FLP. GERD: Still has occasional symptoms, particularly if he eats spicy foods.  I agreed to increase omeprazole to 40 mg every day  but rec  avoid triggers of acid reflux. Asthma: Well-controlled on Symbicort twice daily, hardly ever needs albuterol Vaccine advice: Reluctant to take flu or COVID vaccines RTC 04/2024 CPX

## 2024-01-10 ENCOUNTER — Encounter: Payer: Self-pay | Admitting: Internal Medicine

## 2024-01-10 MED ORDER — LEVOTHYROXINE SODIUM 125 MCG PO TABS: 125.0000 ug | ORAL_TABLET | Freq: Every day | ORAL | 0 refills | Status: DC

## 2024-01-10 NOTE — Addendum Note (Signed)
Addended by: Conrad [place] D on: 01/10/2024 01:04 PM   Modules accepted: Orders

## 2024-02-04 ENCOUNTER — Ambulatory Visit: Payer: Managed Care, Other (non HMO) | Admitting: Family Medicine

## 2024-02-04 ENCOUNTER — Ambulatory Visit: Payer: Self-pay | Admitting: Internal Medicine

## 2024-02-04 ENCOUNTER — Encounter: Payer: Self-pay | Admitting: Family Medicine

## 2024-02-04 VITALS — BP 110/74 | HR 112 | Temp 98.3°F | Resp 22 | Ht 72.0 in | Wt 200.4 lb

## 2024-02-04 DIAGNOSIS — R6889 Other general symptoms and signs: Secondary | ICD-10-CM | POA: Diagnosis not present

## 2024-02-04 DIAGNOSIS — Z1152 Encounter for screening for COVID-19: Secondary | ICD-10-CM

## 2024-02-04 LAB — POCT INFLUENZA A/B
Influenza A, POC: NEGATIVE
Influenza B, POC: NEGATIVE

## 2024-02-04 LAB — POC COVID19 BINAXNOW: SARS Coronavirus 2 Ag: NEGATIVE

## 2024-02-04 MED ORDER — AZITHROMYCIN 250 MG PO TABS: ORAL_TABLET | ORAL | 0 refills | Status: AC

## 2024-02-04 NOTE — Patient Instructions (Signed)
Respiratory Syncytial Virus Infection, Adult Respiratory syncytial virus (RSV) infection is an infection caused by RSV, a common virus. This virus is similar to viruses that cause the common cold and the flu. RSV infection can affect the nose, throat, windpipe, and lungs (respiratory system). When the infection is severe, it can cause: Bronchiolitis. This condition causes inflammation of the air passages in the lungs (bronchioles). Pneumonia. This condition causes inflammation of the air sacs in the lungs. RSV infection spreads from person to person (is contagious) through droplets from coughs and sneezes (respiratory secretions). This condition is rarely serious when it occurs in adults. What are the causes? This condition is caused by contact with RSV. This can happen by: Breathing respiratory secretions from someone who has the infection. Touching something that has been exposed to the virus (is contaminated) and then touching your mouth, nose, or eyes. Coming in close contact with someone who has this infection. This may happen if you: Hug or kiss. Shake or hold hands. Eat or drink using the same dishes or utensils. What increases the risk? The following factors may make you more likely to develop this condition: Being 65 years of age or older. Having certain health conditions, including: A long-term (chronic) lung condition, such as chronic obstructive pulmonary disease (COPD). An immune system that is weak. This is your body's defense system. Down syndrome. Heart disease. Working in a hospital or other health care facility. Living in a long-term health care facility. RSV infections are most common from the months of November to April, but they can happen any time of year. What are the signs or symptoms? Symptoms of this condition include: Having a runny nose. Coughing. You may have a cough that brings up mucus (productive cough). Sneezing. Having a fever. Wanting to eat less than  usual. Breathing loudly (wheezing). Having shortness of breath. Having fluid build up in the lungs (respiratory distress). How is this diagnosed? This condition may be diagnosed based on: Your symptoms. Your medical history. A physical exam. A chest X-ray to rule out pneumonia. Blood tests or tests of mucus from your lungs (sputum). These tests may be done for older adults. A test of a sample of your respiratory secretions. How is this treated? In most cases, the RSV infection will go away after 1-2 weeks of caring for yourself at home.  Sometimes, RSV infection is severe and can cause bronchiolitis or pneumonia. If you develop one or both of these conditions, you may need to be treated in the hospital. You may be given: Oxygen therapy. Antiviral medicine. Medicines to open your bronchioles (bronchodilators). Follow these instructions at home: Medicines Take over-the-counter and prescription medicines only as told by your health care provider. If you were prescribed an antiviral medicine, take it as told by your health care provider. Do not stop using the antiviral even if you start to feel better. Lifestyle  Eat a healthy diet. Do not drink alcohol. Do not use any products that contain nicotine or tobacco, such as cigarettes, e-cigarettes, and chewing tobacco. If you need help quitting, ask your health care provider. Rest at home until your symptoms go away. Return to your normal activities as told by your health care provider. Ask your health care provider what activities are safe for you. General instructions  Drink enough fluid to keep your urine pale yellow. Gargle with a salt-water mixture 3-4 times a day or as needed. To make a salt-water mixture, completely dissolve -1 tsp (3-6 g) of salt in 1   cup (237 mL) of warm water. Keep all follow-up visits as told by your health care provider. This is important. How is this prevented? To prevent catching and spreading RSV: Wash  your hands often with soap and water for at least 20 seconds. If soap and water are not available, use hand sanitizer. Do not touch your face without first cleaning your hands. Stay home if you have symptoms of the common cold or the flu. Cover your nose and mouth when you cough or sneeze. Avoid large groups of people. Keep a safe distance of about 6 feet (1.8 m) from people who are coughing or sneezing. Where to find more information Centers for Disease Control and Prevention: www.cdc.gov Contact a health care provider if: Your symptoms get worse or have not changed after 2 weeks. You have: A fever. Hot flashes, sweating, or chills that keep happening. A cough that brings up much more mucus than usual. A cough that brings up blood. You feel: Very tired (lethargic). Confused. Get help right away if: You have increased or severe trouble breathing. You lose consciousness. These symptoms may represent a serious problem that is an emergency. Do not wait to see if the symptoms will go away. Get medical help right away. Call your local emergency services (911 in the [place] Do not drive yourself to the hospital. Summary Respiratory syncytial virus (RSV) infection is an infection caused by RSV, a common virus. RSV infection can affect the nose, throat, windpipe, and lungs (respiratory system). When the infection is severe, it can cause bronchiolitis or pneumonia. Take over-the-counter and prescription medicines only as told by your health care provider. Contact a health care provider if your symptoms get worse or have not changed after 2 weeks. This information is not intended to replace advice given to you by your health care provider. Make sure you discuss any questions you have with your health care provider. Document Revised: 09/10/2019 Document Reviewed: 09/17/2019 Elsevier Patient Education  2022 Elsevier Inc.  

## 2024-02-04 NOTE — Telephone Encounter (Signed)
 Pt has an appointment today in clinic

## 2024-02-04 NOTE — Telephone Encounter (Signed)
 Copied from CRM [id number]. Topic: Clinical - Red Word Triage >> Feb 04, 2024  1:11 PM Denese Killings wrote: [facility] that prompted transfer to Nurse Triage: Patient has high fever of 100/101 with chills, no appetite, head pressure, body ache, and hurts when he touch his eyeballs since Friday.  Chief Complaint: fever, headache, chills, eye pressure, diarrhea, unable to eat Symptoms: see above Frequency: since Friday Pertinent Negatives: Patient denies cp, sob Disposition: [] ED /[] Urgent Care (no appt availability in office) / [x] Appointment(In office/virtual)/ []  [facility]/ [] Home Care/ [] Refused Recommended Disposition /[] [facility]/ []  Follow-up with PCP Additional Notes: Apt made for today; care advice given, denies questions, instructed to go to the ER if becomes worse.   Reason for Disposition  Fever present > 3 days (72 hours)  Answer Assessment - Initial Assessment Questions 1. TEMPERATURE: "What is the most recent temperature?"  "How was it measured?"      100-101 with chills 2. ONSET: "When did the fever start?"      Friday 3. CHILLS: "Do you have chills?" If yes: "How bad are they?"  (e.g., none, mild, moderate, severe)   - NONE: no chills   - MILD: feeling cold   - MODERATE: feeling very cold, some shivering (feels better under a thick blanket)   - SEVERE: feeling extremely cold with shaking chills (general body shaking, rigors; even under a thick blanket)      moderate 4. OTHER SYMPTOMS: "Do you have any other symptoms besides the fever?"  (e.g., abdomen pain, cough, diarrhea, earache, headache, sore throat, urination pain)     Headache, eye pressure, diarrhea, can't eat 5. CAUSE: If there are no symptoms, ask: "What do you think is causing the fever?"      unknown 6. CONTACTS: "Does anyone else in the family have an infection?"    Brother in law 7. TREATMENT: "What have you done so far to treat this fever?" (e.g., medications)     tylenol 8.  IMMUNOCOMPROMISE: "Do you have of the following: diabetes, HIV positive, splenectomy, cancer chemotherapy, chronic steroid treatment, transplant patient, etc."     denies 9. PREGNANCY: "Is there any chance you are pregnant?" "When was your last menstrual period?"     na 10. TRAVEL: "Have you traveled out of the country in the last month?" (e.g., travel history, exposures)       Denies.  Protocols used: [facility]

## 2024-02-05 ENCOUNTER — Encounter: Payer: Self-pay | Admitting: Family Medicine

## 2024-02-05 NOTE — Progress Notes (Signed)
 Subjective:    Patient ID: Roger Morgan., male    DOB: November 27, 1986, 38 y.o.   MRN: [id number]  Chief Complaint  Patient presents with   Fever    Symptomatic for 3 days.Running a fever on and off. Lost 17 lbs in 1 month. Chills, diarrhea and No appetite. Head pressure    HPI Discussed the use of AI scribe software for clinical note transcription with the patient, who gave verbal consent to proceed.  History of Present Illness Roger Morgan. is a 38 year old male who presents with headache, fever, and cough.  He has been experiencing a severe headache with eye pain and pressure since Thursday, which has persisted through Friday and the weekend. The headache causes blurry vision and a burning sensation in the eyes, although the blurriness has improved slightly today. No chest pain or difficulty breathing, but there is persistent cough and some ear pressure.  He reports a fever that reached 101F, checked by his wife, and describes experiencing chills throughout the duration of his illness. No sore throat, but his brother-in-law had a bad cough and sore throat recently. He has been consuming ginger juice and ginger shots, prepared by his mother-in-law, which he feels have provided some relief.  He has a persistent cough and some ear pressure. He has tested negative for both flu and COVID-19.  He has had a reduced appetite, feeling full after only a few bites of food, but has been drinking a gallon of fluids daily to stay hydrated. He experienced diarrhea on Friday and Saturday, which he attributes to taking Mucinex, but this has improved. No nausea or vomiting.  He has two children, aged eleven and four, at home.    Past Medical History:  Diagnosis Date   Allergy    Asthma    GERD (gastroesophageal reflux disease)     Past Surgical History:  Procedure Laterality Date   great toe surgery     right    HERNIA REPAIR     right    Family History  Problem Relation Age  of Onset   Ulcers Paternal Uncle    Diabetes Other        MGM   Breast cancer Other        aunt   Colon cancer Neg Hx    Prostate cancer Neg Hx    CAD Neg Hx     Social History   Socioeconomic History   Marital status: Married    Spouse name: Not on file   Number of children: 2   Years of education: Not on file   Highest education level: 12th grade  Occupational History   Occupation: Education administrator   Tobacco Use   Smoking status: Former    Current packs/day: 0.00    Types: Cigarettes    Quit date: 10/19/2010    Years since quitting: 13.3   Smokeless tobacco: Never  Substance and Sexual Activity   Alcohol use: Yes    Comment:  rarely   Drug use: Yes    Types: Marijuana    Comment: not daily   Sexual activity: Not on file  Other Topics Concern   Not on file  Social History Narrative   Originally from [place], [place]   Children    2 daughters: 2007, 2013   Son 2021         Social Drivers of Health   Financial Resource Strain: Medium Risk (06/30/2023)   Overall Financial Resource Strain (CARDIA)  Difficulty of Paying Living Expenses: Somewhat hard  Food Insecurity: No Food Insecurity (06/30/2023)   Hunger Vital Sign    Worried About Running Out of Food in the Last Year: Never true    Ran Out of Food in the Last Year: Never true  Transportation Needs: No Transportation Needs (06/30/2023)   PRAPARE - Administrator, Civil Service (Medical): No    Lack of Transportation (Non-Medical): No  Physical Activity: Sufficiently Active (06/30/2023)   Exercise Vital Sign    Days of Exercise per Week: 4 days    Minutes of Exercise per Session: 60 min  Stress: No Stress Concern Present (06/30/2023)   [employer] of Occupational Health - Occupational Stress Questionnaire    Feeling of Stress : Not at all  Social Connections: Socially Integrated (06/30/2023)   Social Connection and Isolation Panel [NHANES]    Frequency of Communication with Friends and Family: More than  three times a week    Frequency of Social Gatherings with Friends and Family: Three times a week    Attends Religious Services: More than 4 times per year    Active Member of Clubs or Organizations: Yes    Attends Engineer, structural: More than 4 times per year    Marital Status: Married  Catering manager Violence: Not on file    Outpatient Medications Prior to Visit  Medication Sig Dispense Refill   albuterol (VENTOLIN HFA) 108 (90 Base) MCG/ACT inhaler Inhale 2 puffs into the lungs every 6 (six) hours as needed for wheezing or shortness of breath. 34 g 1   azelastine (ASTELIN) 0.1 % nasal spray Place 2 sprays into both nostrils at bedtime as needed for rhinitis. Use in each nostril as directed 30 mL 3   budesonide-formoterol (SYMBICORT) 160-4.5 MCG/ACT inhaler Inhale 2 puffs into the lungs in the morning and at bedtime. 30.6 g 1   Colchicine 0.6 MG CAPS Take 1 capsule (0.6 mg total) by mouth 2 (two) times daily as needed. 180 capsule 0   hydrocortisone (ANUSOL-HC) 25 MG suppository Place 1 suppository (25 mg total) rectally 2 (two) times daily as needed for hemorrhoids. 12 suppository 1   levothyroxine (SYNTHROID) 125 MCG tablet Take 1 tablet (125 mcg total) by mouth daily before breakfast. 90 tablet 0   montelukast (SINGULAIR) 10 MG tablet Take 1 tablet (10 mg total) by mouth at bedtime. 90 tablet 1   omeprazole (PRILOSEC) 40 MG capsule Take 1 capsule (40 mg total) by mouth daily before breakfast. 90 capsule 1   No facility-administered medications prior to visit.    Allergies  Allergen Reactions   Avocado Other (See Comments)    Sore throat   Banana Itching    Review of Systems  Constitutional:  Positive for chills, fever and malaise/fatigue.  HENT:  Positive for congestion and ear pain.   Eyes:  Positive for blurred vision.  Respiratory:  Positive for cough and sputum production. Negative for shortness of breath.   Cardiovascular:  Negative for chest pain,  palpitations and leg swelling.  Gastrointestinal:  Negative for abdominal pain, blood in stool and nausea.  Genitourinary:  Negative for dysuria and frequency.  Musculoskeletal:  Negative for falls.  Skin:  Negative for rash.  Neurological:  Positive for headaches. Negative for dizziness and loss of consciousness.  Endo/Heme/Allergies:  Negative for environmental allergies.  Psychiatric/Behavioral:  Negative for depression. The patient is not nervous/anxious.        Objective:    Physical Exam Vitals  reviewed.  Constitutional:      Appearance: Normal appearance. He is ill-appearing and diaphoretic.  HENT:     Head: Normocephalic and atraumatic.     Nose: Nose normal.  Eyes:     Conjunctiva/sclera: Conjunctivae normal.  Cardiovascular:     Rate and Rhythm: Regular rhythm. Tachycardia present.     Pulses: Normal pulses.     Heart sounds: Normal heart sounds. No murmur heard. Pulmonary:     Effort: Pulmonary effort is normal.     Breath sounds: Normal breath sounds. No wheezing.  Abdominal:     Palpations: Abdomen is soft. There is no mass.     Tenderness: There is no abdominal tenderness.  Musculoskeletal:     Cervical back: Normal range of motion.     Right lower leg: No edema.     Left lower leg: No edema.  Skin:    General: Skin is warm.  Neurological:     General: No focal deficit present.     Mental Status: He is alert and oriented to person, place, and time.  Psychiatric:        Mood and Affect: Mood normal.     BP 110/74 (BP Location: Left Arm, Patient Position: Sitting, Cuff Size: Large)   Pulse (!) 112   Temp 98.3 F (36.8 C) (Oral)   Resp (!) 22   Ht 6' (1.829 m)   Wt 200 lb 6.4 oz (90.9 kg)   SpO2 94%   BMI 27.18 kg/m  Wt Readings from Last 3 Encounters:  02/04/24 200 lb 6.4 oz (90.9 kg)  01/07/24 217 lb 4 oz (98.5 kg)  07/02/23 224 lb 2 oz (101.7 kg)    Diabetic Foot Exam - Simple   No data filed    Lab Results  Component Value Date   WBC  7.3 05/11/2023   HGB 14.8 05/11/2023   HCT 42.0 05/11/2023   PLT 357 05/11/2023   GLUCOSE 93 05/11/2023   CHOL 243 (H) 01/07/2024   TRIG 169 (H) 01/07/2024   HDL 51 01/07/2024   LDLDIRECT 152.0 05/04/2021   LDLCALC 161 (H) 01/07/2024   ALT 68 (H) 05/11/2023   AST 30 05/11/2023   NA 137 05/11/2023   K 4.1 05/11/2023   CL 99 05/11/2023   CREATININE 0.95 05/11/2023   BUN 12 05/11/2023   CO2 23 05/11/2023   TSH 4.190 01/07/2024    Lab Results  Component Value Date   TSH 4.190 01/07/2024   Lab Results  Component Value Date   WBC 7.3 05/11/2023   HGB 14.8 05/11/2023   HCT 42.0 05/11/2023   MCV 92.1 05/11/2023   PLT 357 05/11/2023   Lab Results  Component Value Date   NA 137 05/11/2023   K 4.1 05/11/2023   CO2 23 05/11/2023   GLUCOSE 93 05/11/2023   BUN 12 05/11/2023   CREATININE 0.95 05/11/2023   BILITOT 0.6 05/11/2023   ALKPHOS 54 05/04/2021   AST 30 05/11/2023   ALT 68 (H) 05/11/2023   PROT 7.4 05/11/2023   ALBUMIN 4.4 05/04/2021   CALCIUM 9.9 05/11/2023   GFR 97.26 05/04/2021   Lab Results  Component Value Date   CHOL 243 (H) 01/07/2024   Lab Results  Component Value Date   HDL 51 01/07/2024   Lab Results  Component Value Date   LDLCALC 161 (H) 01/07/2024   Lab Results  Component Value Date   TRIG 169 (H) 01/07/2024   Lab Results  Component Value Date  CHOLHDL 4.8 01/07/2024   No results found for: "HGBA1C"     Assessment & Plan:  Encounter for screening for COVID-19 -     POC COVID-19 BinaxNow  Flu-like symptoms -     POCT Influenza A/B  Other orders -     Azithromycin; Take 2 tablets on day 1, then 1 tablet daily on days 2 through 5  Dispense: 6 tablet; Refill: 0    Assessment and Plan Assessment & Plan Upper Respiratory Infection Symptoms include headache, eye pain, blurry vision, cough, ear pressure, and fever. No chest pain or shortness of breath. Negative for flu and COVID-19. Possible RSV or another viral  infection. -Continue hydration and rest. -Consider taking Mucinex, vitamin C, and zinc to boost immune response. -Optional:Fill Z-Pak prescription at patient's discretion if symptoms worsen or do not improve after a week.  Gastrointestinal Symptoms Episodes of diarrhea, possibly related to Mucinex use. No vomiting or blood in stool. -Monitor symptoms and maintain hydration.  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 -Provide work note for absence until Thursday, February 07, 2024. Extend if necessary based on patient's condition.  Follow-up Monitor symptoms and communicate any changes or concerns via [place] message.     Danise Edge, MD

## 2024-02-09 ENCOUNTER — Other Ambulatory Visit: Payer: Self-pay | Admitting: Internal Medicine

## 2024-02-14 ENCOUNTER — Other Ambulatory Visit: Payer: Self-pay | Admitting: Internal Medicine

## 2024-03-05 ENCOUNTER — Other Ambulatory Visit: Payer: Self-pay | Admitting: Internal Medicine

## 2024-03-07 ENCOUNTER — Encounter: Payer: Self-pay | Admitting: Internal Medicine

## 2024-03-26 ENCOUNTER — Other Ambulatory Visit: Payer: Self-pay | Admitting: Internal Medicine

## 2024-04-02 ENCOUNTER — Other Ambulatory Visit: Payer: Self-pay | Admitting: Internal Medicine

## 2024-05-12 ENCOUNTER — Encounter: Payer: Self-pay | Admitting: Internal Medicine

## 2024-05-12 ENCOUNTER — Ambulatory Visit (INDEPENDENT_AMBULATORY_CARE_PROVIDER_SITE_OTHER): Payer: Managed Care, Other (non HMO) | Admitting: Internal Medicine

## 2024-05-12 VITALS — BP 116/66 | HR 65 | Temp 98.2°F | Resp 16 | Ht 72.0 in | Wt 211.0 lb

## 2024-05-12 DIAGNOSIS — E785 Hyperlipidemia, unspecified: Secondary | ICD-10-CM

## 2024-05-12 DIAGNOSIS — E039 Hypothyroidism, unspecified: Secondary | ICD-10-CM

## 2024-05-12 DIAGNOSIS — Z0000 Encounter for general adult medical examination without abnormal findings: Secondary | ICD-10-CM | POA: Diagnosis not present

## 2024-05-12 DIAGNOSIS — M109 Gout, unspecified: Secondary | ICD-10-CM

## 2024-05-12 DIAGNOSIS — J4520 Mild intermittent asthma, uncomplicated: Secondary | ICD-10-CM

## 2024-05-12 NOTE — Progress Notes (Signed)
 Subjective:    Patient ID: Roger Morgan., male    DOB: 26-Jul-1986, 38 y.o.   MRN: [id number]  DOS:  05/12/2024 Type of visit - description: CPX   Here for CPX , no concerns   Review of Systems   A 14 point review of systems is negative    Past Medical History:  Diagnosis Date   Allergy    Asthma    GERD (gastroesophageal reflux disease)     Past Surgical History:  Procedure Laterality Date   great toe surgery     right    HERNIA REPAIR     right   Social History   Socioeconomic History   Marital status: Married    Spouse name: Not on file   Number of children: 2   Years of education: Not on file   Highest education level: 12th grade  Occupational History   Occupation: Education administrator   Tobacco Use   Smoking status: Former    Current packs/day: 0.00    Types: Cigarettes    Quit date: 10/19/2010    Years since quitting: 13.5   Smokeless tobacco: Never  Substance and Sexual Activity   Alcohol use: Yes    Comment:  rarely   Drug use: Yes    Types: Marijuana    Comment: not daily   Sexual activity: Not on file  Other Topics Concern   Not on file  Social History Narrative   Originally from [place], [place]   Children    2 daughters: 2007, 2013   Son 2021         Social Drivers of Health   Financial Resource Strain: Medium Risk (06/30/2023)   Overall Financial Resource Strain (CARDIA)    Difficulty of Paying Living Expenses: Somewhat hard  Food Insecurity: No Food Insecurity (06/30/2023)   Hunger Vital Sign    Worried About Running Out of Food in the Last Year: Never true    Ran Out of Food in the Last Year: Never true  Transportation Needs: No Transportation Needs (06/30/2023)   PRAPARE - Administrator, Civil Service (Medical): No    Lack of Transportation (Non-Medical): No  Physical Activity: Sufficiently Active (06/30/2023)   Exercise Vital Sign    Days of Exercise per Week: 4 days    Minutes of Exercise per Session: 60 min  Stress: No  Stress Concern Present (06/30/2023)   [employer] of Occupational Health - Occupational Stress Questionnaire    Feeling of Stress : Not at all  Social Connections: Socially Integrated (06/30/2023)   Social Connection and Isolation Panel [NHANES]    Frequency of Communication with Friends and Family: More than three times a week    Frequency of Social Gatherings with Friends and Family: Three times a week    Attends Religious Services: More than 4 times per year    Active Member of Clubs or Organizations: Yes    Attends Engineer, structural: More than 4 times per year    Marital Status: Married  Catering manager Violence: Not on file    Current Outpatient Medications  Medication Instructions   albuterol  (VENTOLIN  HFA) 108 (90 Base) MCG/ACT inhaler 2 puffs, Inhalation, Every 6 hours PRN   azelastine  (ASTELIN ) 0.1 % nasal spray 2 sprays, Each Nare, At bedtime PRN, Use in each nostril as directed   budesonide -formoterol  (SYMBICORT ) 160-4.5 MCG/ACT inhaler 2 puffs, Inhalation, 2 times daily   Colchicine  0.6 mg, Oral, 2 times daily PRN  hydrocortisone  (ANUSOL -HC) 25 mg, Rectal, 2 times daily PRN   levothyroxine  (SYNTHROID ) 125 mcg, Oral, Daily before breakfast   montelukast  (SINGULAIR ) 10 mg, Oral, Daily at bedtime   omeprazole  (PRILOSEC) 40 mg, Oral, Daily before breakfast       Objective:   Physical Exam BP 116/66   Pulse 65   Temp 98.2 F (36.8 C) (Oral)   Resp 16   Ht 6' (1.829 m)   Wt 211 lb (95.7 kg)   SpO2 96%   BMI 28.62 kg/m  General: Well developed, NAD, BMI noted Neck: No  thyromegaly  HEENT:  Normocephalic . Face symmetric, atraumatic Lungs:  CTA B Normal respiratory effort, no intercostal retractions, no accessory muscle use. Heart: RRR,  no murmur.  Abdomen:  Not distended, soft, non-tender. No rebound or rigidity.   Lower extremities: no pretibial edema bilaterally  Skin: Exposed areas without rash. Not pale. Not jaundice Neurologic:  alert  & oriented X3.  Speech normal, gait appropriate for age and unassisted Strength symmetric and appropriate for age.  Psych: Cognition and judgment appear intact.  Cooperative with normal attention span and concentration.  Behavior appropriate. No anxious or depressed appearing.     Assessment   Assessment Hypothyroidism Asthma GERD Gout: Dx 04/2023 Allergies Varicose veins Internal hemorrhoids.  Anoscope 04/2022 CV risk: Elevated cholesterol, no tobacco, no FH  PLAN: Here for CPX - Td 2019 - Vax I recommend: flu shot q fall, COVID booster is a consideration -Labs:  CMP FLP   TSH uric acid -Diet and exercise: very active, goes to the gym 5/week, eating healthier Other issues addressed:  Hypothyroidism: Good compliance with Synthroid , check TSH. Dyslipidemia: Not on medications, lifestyle improved, checking labs Asthma: On Symbicort  twice daily, hardly ever uses a rescue inhaler Gout: No recent events, on colchicine  as needed.  Checking a uric acid level. Varicose veins: Noted on today exam, recommend compression stockings RTC 6 months.

## 2024-05-12 NOTE — Patient Instructions (Addendum)
 Vaccines I recommend Flu shot every fall COVID booster is a consideration     GO TO THE LAB :  Get the blood work   Your results will be posted on MyChart with my comments  Next office visit for a checkup in 6 months Please make an appointment before you leave today

## 2024-05-12 NOTE — Assessment & Plan Note (Signed)
 Here for CPX  Other issues addressed:  Hypothyroidism: Good compliance with Synthroid , check TSH. Dyslipidemia: Not on medications, lifestyle improved, checking labs Asthma: On Symbicort  twice daily, hardly ever uses a rescue inhaler Gout: No recent events, on colchicine  as needed.  Checking a uric acid level. Varicose veins: Noted on today exam, recommend compression stockings RTC 6 months.

## 2024-05-12 NOTE — Assessment & Plan Note (Signed)
 Here for CPX - Td 2019 - Vax I recommend: flu shot q fall, COVID booster is a consideration -Labs:  CMP FLP   TSH uric acid -Diet and exercise: very active, goes to the gym 5/week, eating healthier

## 2024-05-13 LAB — COMPREHENSIVE METABOLIC PANEL WITH GFR
ALT: 25 IU/L (ref 0–44)
AST: 20 IU/L (ref 0–40)
Albumin: 4.4 g/dL (ref 4.1–5.1)
Alkaline Phosphatase: 95 IU/L (ref 44–121)
BUN/Creatinine Ratio: 21 — ABNORMAL HIGH (ref 9–20)
BUN: 19 mg/dL (ref 6–20)
Bilirubin Total: 0.3 mg/dL (ref 0.0–1.2)
CO2: 21 mmol/L (ref 20–29)
Calcium: 9.6 mg/dL (ref 8.7–10.2)
Chloride: 104 mmol/L (ref 96–106)
Creatinine, Ser: 0.9 mg/dL (ref 0.76–1.27)
Globulin, Total: 2.3 g/dL (ref 1.5–4.5)
Glucose: 94 mg/dL (ref 70–99)
Potassium: 4.1 mmol/L (ref 3.5–5.2)
Sodium: 139 mmol/L (ref 134–144)
Total Protein: 6.7 g/dL (ref 6.0–8.5)
eGFR: 113 mL/min/{1.73_m2} (ref >59)

## 2024-05-13 LAB — URIC ACID: Uric Acid: 6.5 mg/dL (ref 3.8–8.4)

## 2024-05-13 LAB — LIPID PANEL
Chol/HDL Ratio: 4.4 ratio (ref 0.0–5.0)
Cholesterol, Total: 185 mg/dL (ref 100–199)
HDL: 42 mg/dL (ref >39)
LDL Chol Calc (NIH): 117 mg/dL — ABNORMAL HIGH (ref 0–99)
Triglycerides: 148 mg/dL (ref 0–149)
VLDL Cholesterol Cal: 26 mg/dL (ref 5–40)

## 2024-05-13 LAB — TSH: TSH: 0.799 u[IU]/mL (ref 0.450–4.500)

## 2024-05-14 ENCOUNTER — Ambulatory Visit: Payer: Self-pay | Admitting: Internal Medicine

## 2024-05-19 ENCOUNTER — Other Ambulatory Visit: Payer: Self-pay | Admitting: Internal Medicine

## 2024-07-02 ENCOUNTER — Telehealth: Payer: Self-pay | Admitting: Internal Medicine

## 2024-07-02 ENCOUNTER — Other Ambulatory Visit (INDEPENDENT_AMBULATORY_CARE_PROVIDER_SITE_OTHER)

## 2024-07-02 DIAGNOSIS — E785 Hyperlipidemia, unspecified: Secondary | ICD-10-CM

## 2024-07-02 NOTE — Telephone Encounter (Signed)
 Pt dropped off forms for pcp to complete. Pt wants them to be faxed and called when fax is sent.

## 2024-07-02 NOTE — Telephone Encounter (Signed)
 Pt will need an A1c- this was not completed at [facility] on 05/12/24 for form, he also did not sign the form at the top which is required by the company when I fax the form back. Will reach out to Pt to make him aware.

## 2024-07-02 NOTE — Telephone Encounter (Signed)
 Spoke w/ Pt- lab appt scheduled, he will sign form when he comes to have A1c drawn. Form placed at front desk.

## 2024-07-03 ENCOUNTER — Ambulatory Visit: Payer: Self-pay

## 2024-07-03 LAB — HEMOGLOBIN A1C
Est. average glucose Bld gHb Est-mCnc: 111 mg/dL
Hgb A1c MFr Bld: 5.5 % (ref 4.8–5.6)

## 2024-07-03 NOTE — Telephone Encounter (Signed)
 Spoke w/ Pt- made him aware that form has been faxed back. Pt verbalized understanding.

## 2024-07-03 NOTE — Telephone Encounter (Signed)
 Completed form faxed back to [facility] at [phone]. Form sent for scanning.

## 2024-07-16 ENCOUNTER — Other Ambulatory Visit: Payer: Self-pay | Admitting: Internal Medicine

## 2024-08-18 ENCOUNTER — Other Ambulatory Visit: Payer: Self-pay | Admitting: Internal Medicine

## 2024-08-30 ENCOUNTER — Other Ambulatory Visit: Payer: Self-pay | Admitting: Internal Medicine

## 2024-10-26 ENCOUNTER — Other Ambulatory Visit: Payer: Self-pay | Admitting: Internal Medicine

## 2024-10-29 ENCOUNTER — Other Ambulatory Visit: Payer: Self-pay | Admitting: Internal Medicine

## 2024-11-11 ENCOUNTER — Ambulatory Visit: Admitting: Internal Medicine

## 2024-12-29 ENCOUNTER — Encounter: Payer: Self-pay | Admitting: Internal Medicine

## 2024-12-29 ENCOUNTER — Ambulatory Visit: Admitting: Internal Medicine

## 2024-12-29 VITALS — BP 108/68 | HR 74 | Temp 98.3°F | Resp 16 | Ht 72.0 in | Wt 208.2 lb

## 2024-12-29 DIAGNOSIS — E039 Hypothyroidism, unspecified: Secondary | ICD-10-CM | POA: Diagnosis not present

## 2024-12-29 DIAGNOSIS — J4520 Mild intermittent asthma, uncomplicated: Secondary | ICD-10-CM

## 2024-12-29 NOTE — Progress Notes (Unsigned)
 "  Subjective:    Patient ID: Roger JAYSON Moira Mickey., male    DOB: March 06, 1986, 39 y.o.   MRN: [id number]  DOS:  12/29/2024 Follow-up  Discussed the use of AI scribe software for clinical note transcription with the patient, who gave verbal consent to proceed.  History of Present Illness Roger Labonte. is a 38 year old male who presents for a routine follow-up visit.  Asthma control - Asthma is well controlled - Uses daily inhaler regularly - Uses rescue inhaler only with shortness of breath - No recent exacerbations  Gout management - Treats gout with as-needed colchicine  - No flares in the past three months  Thyroid  disease - Takes thyroid  medication regularly  Gastroesophageal reflux disease - Takes acid reflux medication regularly  General well-being and lifestyle - Feels well with good energy - Follows a home-cooked diet - Exercises by lifting weights every weekday morning  Immunization status - Has not recently received flu or COVID vaccines - Currently declines vaccines because he feels well    Review of Systems See above   Past Medical History:  Diagnosis Date   Allergy    Asthma    GERD (gastroesophageal reflux disease)     Past Surgical History:  Procedure Laterality Date   great toe surgery     right    HERNIA REPAIR     right    Current Outpatient Medications  Medication Instructions   albuterol  (VENTOLIN  HFA) 108 (90 Base) MCG/ACT inhaler 2 puffs, Inhalation, Every 6 hours PRN   azelastine  (ASTELIN ) 0.1 % nasal spray 2 sprays, Each Nare, At bedtime PRN, Use in each nostril as directed   budesonide -formoterol  (SYMBICORT ) 160-4.5 MCG/ACT inhaler 2 puffs, Inhalation, 2 times daily   Colchicine  0.6 mg, Oral, 2 times daily PRN   hydrocortisone  (ANUSOL -HC) 25 mg, Rectal, 2 times daily PRN   levothyroxine  (SYNTHROID ) 125 mcg, Oral, Daily before breakfast   montelukast  (SINGULAIR ) 10 mg, Oral, Daily at bedtime   omeprazole  (PRILOSEC) 40 mg,  Oral, Daily before breakfast       Objective:   Physical Exam BP 108/68   Pulse 74   Temp 98.3 F (36.8 C) (Oral)   Resp 16   Ht 6' (1.829 m)   Wt 208 lb 4 oz (94.5 kg)   SpO2 96%   BMI 28.24 kg/m  General:   Well developed, NAD, BMI noted. HEENT:  Normocephalic . Face symmetric, atraumatic Lungs:  CTA B Normal respiratory effort, no intercostal retractions, no accessory muscle use. Heart: RRR,  no murmur.  Lower extremities: no pretibial edema bilaterally  Skin: Not pale. Not jaundice Neurologic:  alert & oriented X3.  Speech normal, gait appropriate for age and unassisted Psych--  Cognition and judgment appear intact.  Cooperative with normal attention span and concentration.  Behavior appropriate. No anxious or depressed appearing.      Assessment   Assessment Hypothyroidism Asthma GERD Gout: Dx 04/2023 Allergies Varicose veins Internal hemorrhoids.  Anoscope 04/2022 CV risk: Elevated cholesterol, no tobacco, no FH  PLAN: Assessment and Plan Assessment & Plan Hypothyroidism Well-managed with current medication regimen.  Check a TSH, CBC Asthma Well-controlled with Symbicort  twice daily and albuterol  as needed.  He also takes Symbicort . Gout: Well-controlled with no recent flares.  Has colchicine  if needed - Continue colchicine  as needed. Hyperlipidemia His last cholesterol improved significantly with a better lifestyle, he continue with daily exercises and eating healthy.  Praised!  Recheck on RTC Preventive care: Importance of flu and  a COVID booster in the context of asthma discussed with patient.  Declined RTC 05/2025 CPX         "

## 2024-12-29 NOTE — Patient Instructions (Signed)
 Please read your instructions carefully.   GO TO THE LAB :  Get the blood work    Go to the front desk for the checkout Please make an appointment for a physical exam by June 2026

## 2024-12-30 ENCOUNTER — Ambulatory Visit: Payer: Self-pay | Admitting: Internal Medicine

## 2024-12-30 LAB — CBC WITH DIFFERENTIAL/PLATELET
Basophils Absolute: 0.1 x10E3/uL (ref 0.0–0.2)
Basos: 1 %
EOS (ABSOLUTE): 0.2 x10E3/uL (ref 0.0–0.4)
Eos: 4 %
Hematocrit: 39 % (ref 37.5–51.0)
Hemoglobin: 13.8 g/dL (ref 13.0–17.7)
Immature Grans (Abs): 0 x10E3/uL (ref 0.0–0.1)
Immature Granulocytes: 0 %
Lymphocytes Absolute: 2.5 x10E3/uL (ref 0.7–3.1)
Lymphs: 38 %
MCH: 33.7 pg — ABNORMAL HIGH (ref 26.6–33.0)
MCHC: 35.4 g/dL (ref 31.5–35.7)
MCV: 95 fL (ref 79–97)
Monocytes Absolute: 0.6 x10E3/uL (ref 0.1–0.9)
Monocytes: 9 %
Neutrophils Absolute: 3.3 x10E3/uL (ref 1.4–7.0)
Neutrophils: 48 %
Platelets: 249 x10E3/uL (ref 150–450)
RBC: 4.09 x10E6/uL — ABNORMAL LOW (ref 4.14–5.80)
RDW: 12.2 % (ref 11.6–15.4)
WBC: 6.6 x10E3/uL (ref 3.4–10.8)

## 2024-12-30 LAB — TSH: TSH: 1.15 u[IU]/mL (ref 0.450–4.500)

## 2024-12-30 NOTE — Assessment & Plan Note (Signed)
 Hypothyroidism Well-managed with current medication regimen.  Check a TSH, CBC Asthma He also takes Symbicort . Gout: Well-controlled with no recent flares.  Has colchicine  if needed  Hyperlipidemia His last cholesterol improved significantly with a better lifestyle, he continue with daily exercises and eating healthy.  Praised!  Recheck on RTC Preventive care: Importance of flu and a COVID booster in the context of asthma discussed with patient.  Declined RTC 05/2025 CPX

## 2025-01-12 ENCOUNTER — Ambulatory Visit: Payer: Self-pay

## 2025-01-12 NOTE — Telephone Encounter (Signed)
 Appt scheduled

## 2025-01-12 NOTE — Telephone Encounter (Signed)
 FYI Only or Action Required?: FYI only for provider: appointment scheduled on 2/3.  Patient was last seen in primary care on 12/29/2024 by Amon Aloysius BRAVO, MD.  Called Nurse Triage reporting Knee Injury.  Symptoms began several days ago.  Interventions attempted: Rest, hydration, or home remedies.  Symptoms are: unchanged.  Triage Disposition: See Physician Within 24 Hours  Patient/caregiver understands and will follow disposition?: Yes, will follow disposition   Reason for Triage: Knee pain, swollen, and sore. Would like to know if Dr. Amon does x-ray at clinic.  Reason for Disposition  [1] MODERATE pain (e.g., interferes with normal activities, limping) AND [2] high-risk adult (e.g., age > 60 years, osteoporosis, chronic steroid use)  Answer Assessment - Initial Assessment Questions 1. MECHANISM: How did the injury happen? (e.g., twisting injury, direct blow)      Slipped and fell on ice 2. ONSET: When did the injury happen? (e.g., minutes, hours ago)      2 days ago 3. LOCATION: Where is the injury located?      L knee 4. APPEARANCE of INJURY: What does the injury look like?      Swelling, 5. SEVERITY: Can you put weight on that leg? Can you walk?      Can walk, but hurts 7. PAIN: Is there pain? If Yes, ask: How bad is the pain?   What does it keep you from doing? (Scale 0-10; or none, mild, moderate, severe)     5 9. OTHER SYMPTOMS: Do you have any other symptoms?  (e.g., pop when knee injured, swelling, locking, buckling)      Felt like a rubber band snapped  Protocols used: Knee Injury-A-AH

## 2025-01-13 ENCOUNTER — Ambulatory Visit: Admitting: Internal Medicine

## 2025-01-13 ENCOUNTER — Ambulatory Visit: Payer: Self-pay | Admitting: Internal Medicine

## 2025-01-13 ENCOUNTER — Encounter: Payer: Self-pay | Admitting: Internal Medicine

## 2025-01-13 ENCOUNTER — Ambulatory Visit (HOSPITAL_BASED_OUTPATIENT_CLINIC_OR_DEPARTMENT_OTHER)
Admission: RE | Admit: 2025-01-13 | Discharge: 2025-01-13 | Disposition: A | Source: Ambulatory Visit | Attending: Internal Medicine | Admitting: Internal Medicine

## 2025-01-13 VITALS — BP 126/80 | HR 75 | Temp 98.3°F | Resp 16 | Ht 72.0 in | Wt 213.4 lb

## 2025-01-13 DIAGNOSIS — W19XXXA Unspecified fall, initial encounter: Secondary | ICD-10-CM

## 2025-01-13 DIAGNOSIS — S8992XA Unspecified injury of left lower leg, initial encounter: Secondary | ICD-10-CM

## 2025-01-13 NOTE — Patient Instructions (Signed)
 Get x-ray downstairs  Ice the knee twice a day  Wrap the knee daily  We are referring you to the orthopedic doctor.  For pain alternate : Tylenol   500 mg OTC 2 tabs a day every 8 hours as needed for pain  IBUPROFEN (Advil or Motrin) 200 mg 2 tablets every 8 hours as needed for pain.  Always take it with food because may cause gastritis and ulcers.  If you notice nausea, stomach pain, change in the color of stools --->  Stop the medicine and let us  know

## 2025-01-14 NOTE — Assessment & Plan Note (Signed)
 Left knee injury Possible ligament injury.   - Ordered X-ray of the left knee. - Referred to   Orthopedics. - Continue ibuprofen and acetaminophen , alternating every eight hours. - Apply ice twice daily. - Use wrap for support and apply topical cream as needed.

## 2025-01-15 ENCOUNTER — Telehealth: Payer: Self-pay

## 2025-01-15 NOTE — Telephone Encounter (Signed)
 Spoke w/ Pt- informed okay to wait until visit unless more pain. Pt denied increase in pain.

## 2025-01-15 NOTE — Telephone Encounter (Signed)
 It is okay as long as he is not getting worse. Avoid excessive activity.

## 2025-01-15 NOTE — Telephone Encounter (Signed)
 Copied from CRM [id number]. Topic: General - Other >> Jan 15, 2025  8:45 AM Roger Morgan wrote: Reason for CRM: Patient was referred to [facility] ,he called to get scheduled for an appointment and they can't get him until February 11th. He would like to know is it okay to wait until then to be seen by them?

## 2025-06-01 ENCOUNTER — Encounter: Admitting: Internal Medicine
